# Patient Record
Sex: Female | Born: 1990 | Race: Black or African American | Hispanic: No | Marital: Single | Smoking: Never smoker
Health system: Southern US, Community
[De-identification: ages and names within clinical notes are randomized; demographics above are authoritative.]

## PROBLEM LIST (undated history)

## (undated) ENCOUNTER — Inpatient Hospital Stay (HOSPITAL_COMMUNITY): Payer: Medicaid Other

## (undated) DIAGNOSIS — N83209 Unspecified ovarian cyst, unspecified side: Secondary | ICD-10-CM

## (undated) DIAGNOSIS — K219 Gastro-esophageal reflux disease without esophagitis: Secondary | ICD-10-CM

## (undated) HISTORY — PX: UMBILICAL HERNIA REPAIR: SHX196

## (undated) HISTORY — PX: LIPOSUCTION AUOLOGOUS FAT TRANSFER TO BUTTOCKS: SHX6685

## (undated) HISTORY — PX: LEG SURGERY: SHX1003

## (undated) HISTORY — PX: ECTOPIC PREGNANCY SURGERY: SHX613

---

## 2010-03-02 ENCOUNTER — Emergency Department (HOSPITAL_BASED_OUTPATIENT_CLINIC_OR_DEPARTMENT_OTHER): Admission: EM | Admit: 2010-03-02 | Discharge: 2010-03-02 | Payer: Self-pay | Admitting: Emergency Medicine

## 2010-07-21 ENCOUNTER — Emergency Department (HOSPITAL_BASED_OUTPATIENT_CLINIC_OR_DEPARTMENT_OTHER)
Admission: EM | Admit: 2010-07-21 | Discharge: 2010-07-21 | Payer: Self-pay | Source: Home / Self Care | Admitting: Emergency Medicine

## 2011-03-11 LAB — URINALYSIS, ROUTINE W REFLEX MICROSCOPIC
Glucose, UA: NEGATIVE mg/dL
Ketones, ur: NEGATIVE mg/dL
Protein, ur: NEGATIVE mg/dL

## 2011-03-11 LAB — WET PREP, GENITAL

## 2014-04-20 ENCOUNTER — Emergency Department (HOSPITAL_BASED_OUTPATIENT_CLINIC_OR_DEPARTMENT_OTHER)
Admission: EM | Admit: 2014-04-20 | Discharge: 2014-04-20 | Disposition: A | Discharge status: Home / Self Care | Payer: Self-pay | Attending: Emergency Medicine | Admitting: Emergency Medicine

## 2014-04-20 ENCOUNTER — Encounter (HOSPITAL_BASED_OUTPATIENT_CLINIC_OR_DEPARTMENT_OTHER): Payer: Self-pay | Admitting: Emergency Medicine

## 2014-04-20 DIAGNOSIS — B001 Herpesviral vesicular dermatitis: Secondary | ICD-10-CM

## 2014-04-20 DIAGNOSIS — B009 Herpesviral infection, unspecified: Secondary | ICD-10-CM | POA: Insufficient documentation

## 2014-04-20 MED ORDER — ACYCLOVIR 400 MG PO TABS: 400.0000 mg | ORAL_TABLET | Freq: Four times a day (QID) | ORAL | Status: DC

## 2014-04-20 NOTE — Discharge Instructions (Signed)
Herpes Labialis  You have a fever blister or cold sore (herpes labialis). These painful, grouped sores are caused by one of the herpes viruses (HSV1 most commonly). They are usually found around the lips and mouth, but the same infection can also affect other areas on the face such as the nose and eyes. Herpes infections take about 10 days to heal. They often occur again and again in the same spot. Other symptoms may include numbness and tingling in the involved skin, achiness, fever, and swollen glands in the neck. Colds, emotional stress, injuries, or excess sunlight exposure all seem to make herpes reappear. Herpes lip infections are contagious. Direct contact with these sores can spread the infection. It can also be spread to other parts of your own body.  TREATMENT   Herpes labialis is usually self-limited and resolves within 1 week. To reduce pain and swelling, apply ice packs frequently to the sores or suck on popsicles or frozen juice bars. Antiviral medicine may be used by mouth to shorten the duration of the breakout. Avoid spreading the infection by washing your hands often. Be careful not to touch your eyes or genital areas after handling the infected blisters. Do not kiss or have other intimate contact with others. After the blisters are completely healed you may resume contact. Use sunscreen to lessen recurrences.   If this is your first infection with herpes, or if you have a severe or repeated infections, your caregiver may prescribe one of the anti-viral drugs to speed up the healing. If you have sun-related flare-ups despite the use of sunscreen, starting oral anti-viral medicine before a prolonged exposure (going skiing or to the beach) can prevent most episodes.   SEEK IMMEDIATE MEDICAL CARE IF:  · You develop a headache, sleepiness, high fever, vomiting, or severe weakness.  · You have eye irritation, pain, blurred vision or redness.  · You develop a prolonged infection not getting better in 10  days.  Document Released: 12/11/2005 Document Revised: 03/04/2012 Document Reviewed: 10/15/2009  ExitCare® Patient Information ©2014 [employer].

## 2014-04-20 NOTE — ED Notes (Signed)
Upper lip is swollen. She bit her lip last week and now it is swollen and painful with pus coming from it at times.

## 2014-04-20 NOTE — ED Provider Notes (Signed)
CSN: [id number]     Arrival date & time 04/20/14  1452 History   First MD Initiated Contact with Patient 04/20/14 1529     Chief Complaint  Patient presents with  . Oral Swelling     (Consider location/radiation/quality/duration/timing/severity/associated sxs/prior Treatment) Patient is a 23 y.o. female presenting with mouth sores. The history is provided by the patient. No language interpreter was used.  Mouth Lesions Location:  Upper lip Upper lip location:  L outer Quality:  Weeping, ulcerous and red Onset quality:  Gradual Severity:  Moderate Duration:  1 week Progression:  Worsening Chronicity:  New Relieved by:  Nothing Worsened by:  Nothing tried Ineffective treatments:  None tried Associated symptoms: no fever     History reviewed. No pertinent past medical history. History reviewed. No pertinent past surgical history. No family history on file. History  Substance Use Topics  . Smoking status: Never Smoker   . Smokeless tobacco: Not on file  . Alcohol Use: Yes   OB History   Grav Para Term Preterm Abortions TAB SAB Ect Mult Living                 Review of Systems  Constitutional: Negative for fever.  HENT: Positive for mouth sores.   All other systems reviewed and are negative.     Allergies  Review of patient's allergies indicates no known allergies.  Home Medications   Prior to Admission medications   Not on File   BP 141/85  Pulse 72  Temp(Src) 99.6 F (37.6 C) (Oral)  Resp 18  Ht 5\' 4"  (1.626 m)  Wt 180 lb (81.647 kg)  BMI 30.88 kg/m2  SpO2 100%  LMP 04/06/2014 Physical Exam  Nursing note and vitals reviewed. Constitutional: She is oriented to person, place, and time. She appears well-developed and well-nourished.  HENT:  Head: Normocephalic.  Mouth/Throat: Oropharynx is clear and moist.  Cold sore/herpetic appearing lesion left lip  Eyes: EOM are normal.  Neck: Normal range of motion.  Pulmonary/Chest: Effort normal.   Abdominal: She exhibits no distension.  Musculoskeletal: Normal range of motion.  Neurological: She is alert and oriented to person, place, and time.  Psychiatric: She has a normal mood and affect.    ED Course  Procedures (including critical care time) Labs Review Labs Reviewed - No data to display  Imaging Review No results found.   EKG Interpretation None      MDM   Final diagnoses:  Herpes simplex labialis    acyclovir    Lisa AreasLeslie K Ethlyn Alto, PA-C 04/20/14 1552

## 2014-04-21 NOTE — ED Provider Notes (Signed)
Medical screening examination/treatment/procedure(s) were performed by non-physician practitioner and as supervising physician I was immediately available for consultation/collaboration.   EKG Interpretation None        Lisa B. Bernette MayersSheldon, MD 04/21/14 1339

## 2014-06-04 ENCOUNTER — Encounter (HOSPITAL_BASED_OUTPATIENT_CLINIC_OR_DEPARTMENT_OTHER): Payer: Self-pay | Admitting: Emergency Medicine

## 2014-06-04 ENCOUNTER — Emergency Department (HOSPITAL_BASED_OUTPATIENT_CLINIC_OR_DEPARTMENT_OTHER)
Admission: EM | Admit: 2014-06-04 | Discharge: 2014-06-04 | Disposition: A | Discharge status: Home / Self Care | Payer: Self-pay | Attending: Emergency Medicine | Admitting: Emergency Medicine

## 2014-06-04 DIAGNOSIS — S61451A Open bite of right hand, initial encounter: Secondary | ICD-10-CM

## 2014-06-04 DIAGNOSIS — Z79899 Other long term (current) drug therapy: Secondary | ICD-10-CM | POA: Insufficient documentation

## 2014-06-04 DIAGNOSIS — Y929 Unspecified place or not applicable: Secondary | ICD-10-CM | POA: Insufficient documentation

## 2014-06-04 DIAGNOSIS — Y939 Activity, unspecified: Secondary | ICD-10-CM | POA: Insufficient documentation

## 2014-06-04 DIAGNOSIS — S61409A Unspecified open wound of unspecified hand, initial encounter: Secondary | ICD-10-CM | POA: Insufficient documentation

## 2014-06-04 DIAGNOSIS — W540XXA Bitten by dog, initial encounter: Secondary | ICD-10-CM | POA: Insufficient documentation

## 2014-06-04 DIAGNOSIS — Z792 Long term (current) use of antibiotics: Secondary | ICD-10-CM | POA: Insufficient documentation

## 2014-06-04 MED ORDER — AMOXICILLIN-POT CLAVULANATE 875-125 MG PO TABS: 1.0000 | ORAL_TABLET | Freq: Two times a day (BID) | ORAL | Status: DC

## 2014-06-04 NOTE — ED Provider Notes (Signed)
CSN: [id number]     Arrival date & time 06/04/14  1809 History   First MD Initiated Contact with Patient 06/04/14 1921     Chief Complaint  Patient presents with  . Animal Bite     (Consider location/radiation/quality/duration/timing/severity/associated sxs/prior Treatment) Patient is a 23 y.o. female presenting with animal bite. The history is provided by the patient. No language interpreter was used.  Animal Bite Contact animal:  Dog Location:  Hand Hand injury location:  R hand Time since incident:  1 day Pain details:    Quality:  Aching   Severity:  Moderate   Timing:  Constant   Progression:  Worsening Incident location:  Home Notifications:  None Animal's rabies vaccination status:  Up to date Animal in possession: yes   Tetanus status:  Up to date Worsened by:  Nothing tried Ineffective treatments:  None tried   History reviewed. No pertinent past medical history. History reviewed. No pertinent past surgical history. No family history on file. History  Substance Use Topics  . Smoking status: Never Smoker   . Smokeless tobacco: Not on file  . Alcohol Use: Yes   OB History   Grav Para Term Preterm Abortions TAB SAB Ect Mult Living                 Review of Systems  Skin: Positive for wound.  All other systems reviewed and are negative.     Allergies  Review of patient's allergies indicates no known allergies.  Home Medications   Prior to Admission medications   Medication Sig Start Date End Date Taking? Authorizing Provider  acyclovir (ZOVIRAX) 400 MG tablet Take 1 tablet (400 mg total) by mouth 4 (four) times daily. 04/20/14   Elson Areas, PA-C  amoxicillin-clavulanate (AUGMENTIN) 875-125 MG per tablet Take 1 tablet by mouth 2 (two) times daily. 06/04/14   Elson Areas, PA-C   BP 139/85  Pulse 70  Temp(Src) 99.4 F (37.4 C) (Oral)  Resp 18  Ht 5\' 4"  (1.626 m)  Wt 180 lb (81.647 kg)  BMI 30.88 kg/m2  SpO2 99%  LMP 05/21/2014 Physical  Exam  Nursing note and vitals reviewed. Constitutional: She appears well-developed and well-nourished.  HENT:  Head: Normocephalic and atraumatic.  Musculoskeletal: She exhibits tenderness.  Neurological: She is alert.  Skin: There is erythema.  Puncture wound thenar area right hand,      ED Course  Procedures (including critical care time) Labs Review Labs Reviewed - No data to display  Imaging Review No results found.   EKG Interpretation None      MDM   Final diagnoses:  Dog bite of right hand    augmentin    Elson Areas, PA-C 06/04/14 1947

## 2014-06-04 NOTE — Discharge Instructions (Signed)

## 2014-06-04 NOTE — ED Notes (Signed)
Animal bite form faxed to Animal control, per registration

## 2014-06-04 NOTE — ED Notes (Signed)
[facility] notified of animal bite and will send an Technical sales engineer.

## 2014-06-04 NOTE — ED Notes (Addendum)
Puncture wound from dog bite to to base of  Rt thumb and joint of thumb  slight swelling

## 2014-06-04 NOTE — ED Notes (Signed)
Pt was bitten on her right hand by a dog that lives in her apartment complex. She did not report it to animal control.

## 2014-06-06 NOTE — ED Provider Notes (Signed)
History/physical exam/procedure(s) were performed by non-physician practitioner and as supervising physician I was immediately available for consultation/collaboration. I have reviewed all notes and am in agreement with care and plan.   Hilario Quarryanielle S Anniemae Haberkorn, MD 06/06/14 [phone]

## 2017-01-15 ENCOUNTER — Encounter (HOSPITAL_BASED_OUTPATIENT_CLINIC_OR_DEPARTMENT_OTHER): Payer: Self-pay | Admitting: *Deleted

## 2017-01-15 DIAGNOSIS — N760 Acute vaginitis: Secondary | ICD-10-CM | POA: Insufficient documentation

## 2017-01-15 DIAGNOSIS — N730 Acute parametritis and pelvic cellulitis: Secondary | ICD-10-CM | POA: Insufficient documentation

## 2017-01-15 LAB — URINALYSIS, ROUTINE W REFLEX MICROSCOPIC
Bilirubin Urine: NEGATIVE
Glucose, UA: NEGATIVE mg/dL
Hgb urine dipstick: NEGATIVE
Ketones, ur: NEGATIVE mg/dL
LEUKOCYTES UA: NEGATIVE
NITRITE: NEGATIVE
PROTEIN: NEGATIVE mg/dL
SPECIFIC GRAVITY, URINE: 1.04 — AB (ref 1.005–1.030)
pH: 6 (ref 5.0–8.0)

## 2017-01-15 LAB — PREGNANCY, URINE: PREG TEST UR: NEGATIVE

## 2017-01-15 NOTE — ED Triage Notes (Signed)
Pt c/o lower abd/pelvic pain x 2 days, denies discharge

## 2017-01-16 ENCOUNTER — Emergency Department (HOSPITAL_BASED_OUTPATIENT_CLINIC_OR_DEPARTMENT_OTHER)
Admission: EM | Admit: 2017-01-16 | Discharge: 2017-01-16 | Disposition: A | Discharge status: Home / Self Care | Payer: Self-pay | Attending: Emergency Medicine | Admitting: Emergency Medicine

## 2017-01-16 DIAGNOSIS — N730 Acute parametritis and pelvic cellulitis: Secondary | ICD-10-CM

## 2017-01-16 DIAGNOSIS — B9689 Other specified bacterial agents as the cause of diseases classified elsewhere: Secondary | ICD-10-CM

## 2017-01-16 DIAGNOSIS — N760 Acute vaginitis: Secondary | ICD-10-CM

## 2017-01-16 LAB — WET PREP, GENITAL
Sperm: NONE SEEN
Trich, Wet Prep: NONE SEEN
WBC, Wet Prep HPF POC: NONE SEEN
Yeast Wet Prep HPF POC: NONE SEEN

## 2017-01-16 MED ORDER — METRONIDAZOLE 500 MG PO TABS
500.0000 mg | ORAL_TABLET | Freq: Once | ORAL | Status: AC
  Administered 2017-01-16: 500 mg via ORAL
  Filled 2017-01-16: qty 1

## 2017-01-16 MED ORDER — CEFTRIAXONE SODIUM 250 MG IJ SOLR
250.0000 mg | Freq: Once | INTRAMUSCULAR | Status: AC
  Administered 2017-01-16: 250 mg via INTRAMUSCULAR
  Filled 2017-01-16: qty 250

## 2017-01-16 MED ORDER — IBUPROFEN 800 MG PO TABS: 800.0000 mg | ORAL_TABLET | Freq: Three times a day (TID) | ORAL | 0 refills | Status: DC | PRN

## 2017-01-16 MED ORDER — DOXYCYCLINE HYCLATE 100 MG PO TABS
100.0000 mg | ORAL_TABLET | Freq: Once | ORAL | Status: AC
  Administered 2017-01-16: 100 mg via ORAL
  Filled 2017-01-16: qty 1

## 2017-01-16 MED ORDER — LIDOCAINE HCL (PF) 1 % IJ SOLN
INTRAMUSCULAR | Status: AC
  Administered 2017-01-16: 1.2 mL
  Filled 2017-01-16: qty 5

## 2017-01-16 MED ORDER — METRONIDAZOLE 500 MG PO TABS: 500.0000 mg | ORAL_TABLET | Freq: Two times a day (BID) | ORAL | 0 refills | Status: DC

## 2017-01-16 MED ORDER — DOXYCYCLINE HYCLATE 100 MG PO CAPS: 100.0000 mg | ORAL_CAPSULE | Freq: Two times a day (BID) | ORAL | 0 refills | Status: DC

## 2017-01-16 NOTE — ED Provider Notes (Signed)
By signing my name below, I, Vista Mink, attest that this documentation has been prepared under the direction and in the presence of Ariel Dimitri N Denilson Salminen, DO. Electronically signed, Vista Mink, ED Scribe. 01/16/17. 2:38 AM.  TIME SEEN: 2:36 AM  CHIEF COMPLAINT: Abdominal pain.  HPI:  HPI Comments: Lisa Fry is a 26 y.o. female who presents to the Emergency Department complaining of waxing and waning, sharp lower/suprapubic abdominal pain that started two days ago. When she went to urinate yesterday morning she noted blood on the toilet tissue which worried her. She describes this pain as a cramping, sharp sensation. Her LNMP was 01/03/16 of this month. Pt has had chlamydia in the past. She does note concerns for possible STD's. She is currently sexually active with one partner and does not always use protection. No fever, chills, nausea, vomiting, diarrhea. No abnormal vaginal discharge. She is G3P3A0.  No previous abdominal surgery.  ROS: See HPI Constitutional: no fever  Eyes: no drainage  ENT: no runny nose   Cardiovascular:  no chest pain  Resp: no SOB  GI: no vomiting GU: no dysuria or hematuria Integumentary: no rash  Allergy: no hives  Musculoskeletal: no leg swelling  Neurological: no slurred speech ROS otherwise negative  PAST MEDICAL HISTORY/PAST SURGICAL HISTORY:  History reviewed. No pertinent past medical history.  MEDICATIONS:  Prior to Admission medications   Not on File    ALLERGIES:  No Known Allergies  SOCIAL HISTORY:  Social History  Substance Use Topics  . Smoking status: Never Smoker  . Smokeless tobacco: Not on file  . Alcohol use Yes    FAMILY HISTORY: No family history on file.  EXAM: BP 124/89   Pulse 78   Temp 97.8 F (36.6 C)   Resp 16   Ht 5\' 4"  (1.626 m)   Wt 215 lb (97.5 kg)   LMP 12/31/2016   SpO2 100%   BMI 36.90 kg/m  CONSTITUTIONAL: Alert and oriented and responds appropriately to questions. Well-appearing; well-nourished,  Afebrile, no distress HEAD: Normocephalic EYES: Conjunctivae clear, PERRL, EOMI ENT: normal nose; no rhinorrhea; moist mucous membranes NECK: Supple, no meningismus, no nuchal rigidity, no LAD  CARD: RRR; S1 and S2 appreciated; no murmurs, no clicks, no rubs, no gallops RESP: Normal chest excursion without splinting or tachypnea; breath sounds clear and equal bilaterally; no wheezes, no rhonchi, no rales, no hypoxia or respiratory distress, speaking full sentences ABD/GI: Normal bowel sounds; non-distended; soft, non-tender, no rebound, no guarding, no peritoneal signs, no hepatosplenomegaly GU:  Normal external genitalia. No lesions, rashes noted. Patient has no vaginal bleeding on exam. Thick white foul-smelling vaginal discharge.  No adnexal tenderness, mass or fullness.  Patient does have mild cervical motion tenderness that she reports is increased from baseline pelvic exam. Cervix is not appear friable.  Cervix is closed.  Chaperone present for exam. BACK:  The back appears normal and is non-tender to palpation, there is no CVA tenderness EXT: Normal ROM in all joints; non-tender to palpation; no edema; normal capillary refill; no cyanosis, no calf tenderness or swelling    SKIN: Normal color for age and race; warm; no rash NEURO: Moves all extremities equally, sensation to light touch intact diffusely, cranial nerves II through XII intact, normal speech PSYCH: The patient's mood and manner are appropriate. Grooming and personal hygiene are appropriate.  MEDICAL DECISION MAKING: Patient here with mouth swelling vaginal discharge. Wet prep positive for clue cells. Given symptomatically treat with Flagyl. Also has cervical motion tenderness on  exam that is very mild. Will cover for acute PID. Will give ceftriaxone and discharged on doxycycline for the next 2 weeks. Urine shows no infection and she is not pregnant. No adnexal tenderness or masses to suggest TOA, torsion. Doubt appendicitis.  Recommended alternating Tylenol and Motrin. Given outpatient OB/GYN follow-up. Recommended that patient avoid any sexual intercourse until her antibiotics are complete and recommended that her partner be checked for STDs as well. Recommended follow-up with the health department or a PCP or OB/GYN for HIV, hepatitis, syphilis testing.  At this time, I do not feel there is any life-threatening condition present. I have reviewed and discussed all results (EKG, imaging, lab, urine as appropriate) and exam findings with patient/family. I have reviewed nursing notes and appropriate previous records.  I feel the patient is safe to be discharged home without further emergent workup and can continue workup as an outpatient as needed. Discussed usual and customary return precautions. Patient/family verbalize understanding and are comfortable with this plan.  Outpatient follow-up has been provided. All questions have been answered.   I personally performed the services described in this documentation, which was scribed in my presence. The recorded information has been reviewed and is accurate.     Layla MawKristen N Shana Zavaleta, DO 01/16/17 [phone]

## 2017-01-16 NOTE — Discharge Instructions (Signed)
You may alternate between Tylenol 1000 mg every 6 hours as needed for fever, pain and ibuprofen 800 mg every 8 hours as needed for fever and pain. Please avoid any sexual intercourse for 2 weeks until your doxycycline is complete. Your partner will need to be checked for sexually transmitted diseases. I recommend close follow-up with your OB/GYN. If you do not have an OB/GYN, we have provided with a list below.     [email or web address] [address] [place], [place] [phone]    [facility] [email or web address] [address] [place], [place] [phone]    [facility] [address] [place], [place] [phone]   [facility] [email or web address] [address] [place], [place] [phone]   [facility] [email or web address] [address] [place], [place] [phone]   [email or web address] [address] [place], [place] [phone]

## 2017-01-17 LAB — GC/CHLAMYDIA PROBE AMP (~~LOC~~) NOT AT ARMC
CHLAMYDIA, DNA PROBE: NEGATIVE
NEISSERIA GONORRHEA: NEGATIVE

## 2017-10-05 ENCOUNTER — Emergency Department (HOSPITAL_BASED_OUTPATIENT_CLINIC_OR_DEPARTMENT_OTHER)
Admission: EM | Admit: 2017-10-05 | Discharge: 2017-10-05 | Disposition: A | Discharge status: Home / Self Care | Payer: Self-pay | Attending: Emergency Medicine | Admitting: Emergency Medicine

## 2017-10-05 ENCOUNTER — Emergency Department (HOSPITAL_BASED_OUTPATIENT_CLINIC_OR_DEPARTMENT_OTHER): Payer: Self-pay

## 2017-10-05 ENCOUNTER — Encounter (HOSPITAL_BASED_OUTPATIENT_CLINIC_OR_DEPARTMENT_OTHER): Payer: Self-pay

## 2017-10-05 DIAGNOSIS — Z87891 Personal history of nicotine dependence: Secondary | ICD-10-CM | POA: Insufficient documentation

## 2017-10-05 DIAGNOSIS — R1110 Vomiting, unspecified: Secondary | ICD-10-CM

## 2017-10-05 DIAGNOSIS — R110 Nausea: Secondary | ICD-10-CM | POA: Insufficient documentation

## 2017-10-05 DIAGNOSIS — A599 Trichomoniasis, unspecified: Secondary | ICD-10-CM | POA: Insufficient documentation

## 2017-10-05 DIAGNOSIS — R112 Nausea with vomiting, unspecified: Secondary | ICD-10-CM | POA: Insufficient documentation

## 2017-10-05 HISTORY — DX: Unspecified ovarian cyst, unspecified side: N83.209

## 2017-10-05 LAB — COMPREHENSIVE METABOLIC PANEL
ALT: 18 U/L (ref 14–54)
ANION GAP: 7 (ref 5–15)
AST: 19 U/L (ref 15–41)
Albumin: 3.6 g/dL (ref 3.5–5.0)
Alkaline Phosphatase: 56 U/L (ref 38–126)
BUN: 11 mg/dL (ref 6–20)
CHLORIDE: 103 mmol/L (ref 101–111)
CO2: 27 mmol/L (ref 22–32)
Calcium: 8.5 mg/dL — ABNORMAL LOW (ref 8.9–10.3)
Creatinine, Ser: 0.78 mg/dL (ref 0.44–1.00)
GFR calc non Af Amer: >60 mL/min (ref >60)
Glucose, Bld: 110 mg/dL — ABNORMAL HIGH (ref 65–99)
POTASSIUM: 3.3 mmol/L — AB (ref 3.5–5.1)
SODIUM: 137 mmol/L (ref 135–145)
Total Bilirubin: 0.5 mg/dL (ref 0.3–1.2)
Total Protein: 7.3 g/dL (ref 6.5–8.1)

## 2017-10-05 LAB — CBC WITH DIFFERENTIAL/PLATELET
BASOS ABS: 0 10*3/uL (ref 0.0–0.1)
BASOS PCT: 0 %
EOS ABS: 0 10*3/uL (ref 0.0–0.7)
Eosinophils Relative: 0 %
HCT: 37 % (ref 36.0–46.0)
Hemoglobin: 12.1 g/dL (ref 12.0–15.0)
Lymphocytes Relative: 26 %
Lymphs Abs: 1.2 10*3/uL (ref 0.7–4.0)
MCH: 29.4 pg (ref 26.0–34.0)
MCHC: 32.7 g/dL (ref 30.0–36.0)
MCV: 90 fL (ref 78.0–100.0)
MONO ABS: 0.3 10*3/uL (ref 0.1–1.0)
Monocytes Relative: 7 %
Neutro Abs: 3.1 10*3/uL (ref 1.7–7.7)
Neutrophils Relative %: 67 %
PLATELETS: 194 10*3/uL (ref 150–400)
RBC: 4.11 MIL/uL (ref 3.87–5.11)
RDW: 13.5 % (ref 11.5–15.5)
WBC: 4.6 10*3/uL (ref 4.0–10.5)

## 2017-10-05 LAB — URINALYSIS, MICROSCOPIC (REFLEX)

## 2017-10-05 LAB — URINALYSIS, ROUTINE W REFLEX MICROSCOPIC
GLUCOSE, UA: NEGATIVE mg/dL
Ketones, ur: NEGATIVE mg/dL
Nitrite: NEGATIVE
PH: 6.5 (ref 5.0–8.0)
Protein, ur: NEGATIVE mg/dL
Specific Gravity, Urine: 1.02 (ref 1.005–1.030)

## 2017-10-05 LAB — PREGNANCY, URINE: Preg Test, Ur: NEGATIVE

## 2017-10-05 LAB — LIPASE, BLOOD: Lipase: 19 U/L (ref 11–51)

## 2017-10-05 MED ORDER — METRONIDAZOLE 500 MG PO TABS
2000.0000 mg | ORAL_TABLET | Freq: Once | ORAL | Status: AC
  Administered 2017-10-05: 2000 mg via ORAL
  Filled 2017-10-05: qty 4

## 2017-10-05 MED ORDER — OMEPRAZOLE 20 MG PO CPDR: 20.0000 mg | DELAYED_RELEASE_CAPSULE | Freq: Every day | ORAL | 1 refills | Status: DC

## 2017-10-05 MED ORDER — ONDANSETRON 4 MG PO TBDP
4.0000 mg | ORAL_TABLET | Freq: Once | ORAL | Status: AC
  Administered 2017-10-05: 4 mg via ORAL
  Filled 2017-10-05: qty 1

## 2017-10-05 MED ORDER — MECLIZINE HCL 25 MG PO TABS: 25.0000 mg | ORAL_TABLET | Freq: Three times a day (TID) | ORAL | 0 refills | Status: DC | PRN

## 2017-10-05 MED FILL — OMEPRAZOLE 20 MG CAP: 20 | 30 days supply | Qty: 30 | Fill #0

## 2017-10-05 MED FILL — MECLIZINE 25 MG TABLET: 25 | 10 days supply | Qty: 30 | Fill #0

## 2017-10-05 NOTE — Discharge Instructions (Signed)
You've been treated today for your trichimoniasis infection. Please have any partners to sleep with it and treated as well.. I'm discharging you with medications for vomiting. Please make sure to drink plenty of water and small sips throughout the day. Please make sure to eat several small meals between 6 and 8 small meals a day instead of 3 large meals. This may be very helpful in making sure you do not vomit. I'm also giving you something to reduce the acid levels in your stomach and a nausea medication. He will need to follow-up with a gastroenterologist if he continued to have persistent vomiting. You may also try a liquid diet and give you instructions on this as well.  Get help right away if: You have pain in your chest, neck, arm, or jaw. You feel extremely weak or you faint. You have persistent vomiting. You have vomit that is bright red or looks like black coffee grounds. You have stools that are bloody or black, or stools that look like tar. You have severe pain, cramping, or bloating in your abdomen. You have a severe headache, a stiff neck, or both. You have a rash. You have trouble breathing or you are breathing very quickly. Your heart is beating very quickly. Your skin feels cold and clammy. You feel confused. You have pain while urinating. You have signs of dehydration, such as: Dark urine, or very little or no urine. Cracked lips. Dry mouth. Sunken eyes. Sleepiness. Weakness.

## 2017-10-05 NOTE — ED Notes (Signed)
Patient was given saltines and water. Patient was told to ring call light if vomiting.

## 2017-10-05 NOTE — ED Triage Notes (Signed)
C/o n/v x 2 weeks-NAD-steady gait 

## 2017-10-05 NOTE — ED Notes (Signed)
Given ginger ale and graham crackers. 

## 2017-10-05 NOTE — ED Provider Notes (Signed)
[facility] Note   CSN: [id number] Arrival date & time: 10/05/17  1139     History   Chief Complaint Chief Complaint  Patient presents with  . Emesis    HPI Lisa Fry is a 26 y.o. female who presents emergency Department with chief complaint of post prandial vomiting. Patient states that for the past 2 days every time she eats solid foods about 45 minutes to an hour afterwards she vomits. She denies abdominal pain. She does have a sensation of nausea and fullness in her abdomen. She denies belly satiety. She states that she has tried things like spaghetti, tacos, chicken, rice and none of them will go down without her vomiting afterward. She has not tried any medications for acid reduction. She does have a history of occasional reflux. Fevers, chills, nausea, vomiting, previous abdominal surgeries, constipation. She denies heavy marijuana use, she is able to hold down fluids.  HPI  Past Medical History:  Diagnosis Date  . Cyst of ovary     There are no active problems to display for this patient.   Past Surgical History:  Procedure Laterality Date  . LEG SURGERY Left   . UMBILICAL HERNIA REPAIR      OB History    Gravida Para Term Preterm AB Living   0 1 3   SAB TAB Ectopic Multiple Live Births   0 1 0 0 3       Home Medications    Prior to Admission medications   Not on File    Family History No family history on file.  Social History Social History  Substance Use Topics  . Smoking status: Former Games developer  . Smokeless tobacco: Never Used  . Alcohol use Yes     Comment: occ     Allergies   Patient has no known allergies.   Review of Systems Review of Systems  Ten systems reviewed and are negative for acute change, except as noted in the HPI.   Physical Exam Updated Vital Signs BP (!) 148/93 (BP Location: Left Arm)   Pulse (!) 58   Temp 99.2 F (37.3 C) (Oral)   Resp 16   Ht  (1.626 m)   Wt 88.5 kg  (195 lb)   LMP 09/28/2017   SpO2 100%   BMI 33.47 kg/m   Physical Exam  Constitutional: She is oriented to person, place, and time. She appears well-developed and well-nourished. No distress.  HENT:  Head: Normocephalic and atraumatic.  Eyes: Conjunctivae are normal. No scleral icterus.  Neck: Normal range of motion.  Cardiovascular: Normal rate, regular rhythm and normal heart sounds.  Exam reveals no gallop and no friction rub.   No murmur heard. Pulmonary/Chest: Effort normal and breath sounds normal. No respiratory distress.  Abdominal: Soft. Bowel sounds are normal. She exhibits no distension and no mass. There is no tenderness. There is no guarding.  Neurological: She is alert and oriented to person, place, and time.  Skin: Skin is warm and dry. She is not diaphoretic.  Psychiatric: Her behavior is normal.  Nursing note and vitals reviewed.    ED Treatments / Results  Labs (all labs ordered are listed, but only abnormal results are displayed) Labs Reviewed  URINALYSIS, ROUTINE W REFLEX MICROSCOPIC - Abnormal; Notable for the following:       Result Value   Color, Urine AMBER (*)    APPearance CLOUDY (*)    Hgb urine dipstick MODERATE (*)  Bilirubin Urine SMALL (*)    Leukocytes, UA SMALL (*)    All other components within normal limits  URINALYSIS, MICROSCOPIC (REFLEX) - Abnormal; Notable for the following:    Bacteria, UA MANY (*)    Squamous Epithelial / LPF 0-5 (*)    All other components within normal limits  COMPREHENSIVE METABOLIC PANEL - Abnormal; Notable for the following:    Potassium 3.3 (*)    Glucose, Bld 110 (*)    Calcium 8.5 (*)    All other components within normal limits  PREGNANCY, URINE  CBC WITH DIFFERENTIAL/PLATELET  LIPASE, BLOOD    EKG  EKG Interpretation None       Radiology US Abdomen Complete  Result Date: 10/05/2017 CLINICAL DATA:  Postprandial vomiting x2 weeks EXAM: COMPLETE ABDOMINAL ULTRASOUND FINDINGS: Gallbladder:  Physiologically distended without stones, wall thickening, or pericholecystic fluid. 4 mm gallbladder wall polyp. Sonographer reports no sonographic Murphy's sign. Common bile duct:  Normal in caliber, 1.66mm diameter. Liver: Homogeneous in echotexture without focal lesion or intrahepatic bile duct dilatation. Normal color flow signal in portal vein. IVC:  Negative Pancreas: Visualized segments unremarkable, portions obscured by overlying bowel gas. Spleen:  No focal lesion, craniocaudal  0.2cm in length. Right Kidney:  No mass or hydronephrosis, 10.6cm in length. Left Kidney:  No lesion or hydronephrosis, 11cm in length. Abdominal aorta:  Negative IMPRESSION: 1. No acute findings. Electronically Signed   By: Corlis Leak M.D.   On: 10/05/2017 15:07    Procedures Procedures (including critical care time)  Medications Ordered in ED Medications  ondansetron (ZOFRAN-ODT) disintegrating tablet 4 mg (4 mg Oral Given 10/05/17 1502)     Initial Impression / Assessment and Plan / ED Course  I have reviewed the triage vital signs and the nursing notes.  Pertinent labs & imaging results that were available during my care of the patient were reviewed by me and considered in my medical decision making (see chart for details).     Patient without active vomiting here. Might mild hypokalemia. Her ultrasound is negative for any acute abnormalities. Patient is found to be positive for trichomoniasis and treated with 2 g of oral Flagyl. She did not vomit after treatment. Patient will be discharged with acid reducer and antinausea medications. Have advised the patient to eat several small meals a day. She may also try a liquid diet. She appears safe for discharge at this time he follow up with gastroenterology. I discussed return precautions.  Final Clinical Impressions(s) / ED Diagnoses   Final diagnoses:  Postprandial vomiting  Trichimoniasis    New Prescriptions New Prescriptions   No medications on file      Arthor Captain, PA-C 10/05/17 1620    Rolan Bucco, MD 10/05/17 1920

## 2017-10-05 NOTE — ED Notes (Signed)
Patient transported to Ultrasound 

## 2018-01-17 ENCOUNTER — Emergency Department (HOSPITAL_BASED_OUTPATIENT_CLINIC_OR_DEPARTMENT_OTHER)
Admission: EM | Admit: 2018-01-17 | Discharge: 2018-01-17 | Disposition: A | Discharge status: Home / Self Care | Payer: Self-pay | Attending: Emergency Medicine | Admitting: Emergency Medicine

## 2018-01-17 ENCOUNTER — Other Ambulatory Visit: Payer: Self-pay

## 2018-01-17 ENCOUNTER — Encounter (HOSPITAL_BASED_OUTPATIENT_CLINIC_OR_DEPARTMENT_OTHER): Payer: Self-pay | Admitting: Emergency Medicine

## 2018-01-17 DIAGNOSIS — Z87891 Personal history of nicotine dependence: Secondary | ICD-10-CM | POA: Insufficient documentation

## 2018-01-17 DIAGNOSIS — R3915 Urgency of urination: Secondary | ICD-10-CM | POA: Insufficient documentation

## 2018-01-17 DIAGNOSIS — Z79899 Other long term (current) drug therapy: Secondary | ICD-10-CM | POA: Insufficient documentation

## 2018-01-17 DIAGNOSIS — J069 Acute upper respiratory infection, unspecified: Secondary | ICD-10-CM | POA: Insufficient documentation

## 2018-01-17 LAB — URINALYSIS, MICROSCOPIC (REFLEX)

## 2018-01-17 LAB — URINALYSIS, ROUTINE W REFLEX MICROSCOPIC
BILIRUBIN URINE: NEGATIVE
GLUCOSE, UA: NEGATIVE mg/dL
Ketones, ur: NEGATIVE mg/dL
Leukocytes, UA: NEGATIVE
Nitrite: NEGATIVE
Protein, ur: NEGATIVE mg/dL
SPECIFIC GRAVITY, URINE: 1.02 (ref 1.005–1.030)
pH: 6.5 (ref 5.0–8.0)

## 2018-01-17 LAB — PREGNANCY, URINE: PREG TEST UR: NEGATIVE

## 2018-01-17 MED ORDER — FLUTICASONE PROPIONATE 50 MCG/ACT NA SUSP: 1.0000 | Freq: Every day | NASAL | 2 refills | Status: DC

## 2018-01-17 NOTE — ED Triage Notes (Signed)
Patient states that she is having a "headahce" from her nose with pressure to her forehead. The patient describes it as a "siunus pressure" with the headache  - She also reports urinary frequency "like UTI symptoms"

## 2018-01-17 NOTE — Discharge Instructions (Signed)
Please read attached information. If you experience any new or worsening signs or symptoms please return to the emergency room for evaluation. Please follow-up with your primary care provider or specialist as discussed. Please use medication prescribed only as directed and discontinue taking if you have any concerning signs or symptoms.   °

## 2018-01-17 NOTE — ED Provider Notes (Signed)
` [facility] EMERGENCY DEPARTMENT Provider Note   CSN: [id number] Arrival date & time: 01/17/18  1007     History   Chief Complaint Chief Complaint  Patient presents with  . Nasal Congestion  . Dysuria    HPI Lisa Fry is a 27 y.o. female.  HPI   27 year old female presents today with complaints of nasal congestion.  Patient notes symptoms started approximately 4 days ago with nausea and vomiting no diarrhea.  She notes the symptoms completely resolved, but has developed rhinorrhea, nasal congestion.  She notes that she has no purulent discharge from her nose, reports using Mucinex sinus without symptomatic improvement.  She notes she is having pressure in her sinuses denies any specific pain.  She denies any fever, denies any sore throat chest pain shortness of breath, or abdominal pain.  Patient additionally notes that she is having urinary symptoms, with urinary urgency over the last week, noting this feels somewhat [place] to previous UTI.    Past Medical History:  Diagnosis Date  . Cyst of ovary     There are no active problems to display for this patient.   Past Surgical History:  Procedure Laterality Date  . LEG SURGERY Left   . UMBILICAL HERNIA REPAIR      OB History    Gravida Para Term Preterm AB Living   4 3 3  0 1 3   SAB TAB Ectopic Multiple Live Births   0 1 0 0 3       Home Medications    Prior to Admission medications   Medication Sig Start Date End Date Taking? Authorizing Provider  fluticasone (FLONASE) 50 MCG/ACT nasal spray Place 1 spray into both nostrils daily. 01/17/18   Antoinett Dorman, Tinnie Gens, PA-C  meclizine (ANTIVERT) 25 MG tablet Take 1 tablet (25 mg total) by mouth 3 (three) times daily as needed for dizziness. 10/05/17   Arthor Captain, PA-C  omeprazole (PRILOSEC) 20 MG capsule Take 1 capsule (20 mg total) by mouth daily. 10/05/17   Arthor Captain, PA-C    Family History History reviewed. No pertinent family  history.  Social History Social History   Tobacco Use  . Smoking status: Former Games developer  . Smokeless tobacco: Never Used  Substance Use Topics  . Alcohol use: Yes    Comment: occ  . Drug use: No    Comment: former      Allergies   Patient has no known allergies.   Review of Systems Review of Systems  All other systems reviewed and are negative.    Physical Exam Updated Vital Signs BP 132/82   Pulse 64   Temp 98.3 F (36.8 C) (Oral)   Resp 18   Ht 5\' 4"  (1.626 m)   Wt 86.2 kg (190 lb)   LMP 12/27/2017   SpO2 100%   BMI 32.61 kg/m   Physical Exam  Constitutional: She is oriented to person, place, and time. She appears well-developed and well-nourished.  HENT:  Head: Normocephalic and atraumatic.  Right Ear: Hearing and tympanic membrane normal.  Left Ear: Hearing and tympanic membrane normal.  Mouth/Throat: Uvula is midline and mucous membranes are normal. No oropharyngeal exudate, posterior oropharyngeal edema, posterior oropharyngeal erythema or tonsillar abscesses. Tonsils are 0 on the right. Tonsils are 0 on the left. No tonsillar exudate.  Eyes: Conjunctivae are normal. Pupils are equal, round, and reactive to light. Right eye exhibits no discharge. Left eye exhibits no discharge. No scleral icterus.  Neck: Normal range of motion.  No JVD present. No tracheal deviation present.  Pulmonary/Chest: Effort normal. No stridor.  Abdominal: She exhibits no distension and no mass. There is no tenderness. There is no rebound and no guarding. No hernia.  Neurological: She is alert and oriented to person, place, and time. Coordination normal.  Psychiatric: She has a normal mood and affect. Her behavior is normal. Judgment and thought content normal.  Nursing note and vitals reviewed.    ED Treatments / Results  Labs (all labs ordered are listed, but only abnormal results are displayed) Labs Reviewed  URINALYSIS, ROUTINE W REFLEX MICROSCOPIC - Abnormal; Notable for  the following components:      Result Value   Hgb urine dipstick SMALL (*)    All other components within normal limits  URINALYSIS, MICROSCOPIC (REFLEX) - Abnormal; Notable for the following components:   Bacteria, UA RARE (*)    Squamous Epithelial / LPF 0-5 (*)    All other components within normal limits  URINE CULTURE  PREGNANCY, URINE    EKG  EKG Interpretation None       Radiology No results found.  Procedures Procedures (including critical care time)  Medications Ordered in ED Medications - No data to display   Initial Impression / Assessment and Plan / ED Course  I have reviewed the triage vital signs and the nursing notes.  Pertinent labs & imaging results that were available during my care of the patient were reviewed by me and considered in my medical decision making (see chart for details).     Final Clinical Impressions(s) / ED Diagnoses   Final diagnoses:  Viral upper respiratory tract infection  Urinary urgency    Labs: Urinalysis, urine pregnancy  Imaging:   Consults:  Therapeutics:  Discharge Meds: Flonase  Assessment/Plan:   27 year old female presents today with several complaints.  Patient with upper respiratory congestion.  This appears to be sinus related, no signs of acute bacterial sinusitis.  She will be treated symptomatically and given strict return precautions.  Patient also having urinary urgency, her urine is very reassuring with no signs of urinary tract infection.  Patient is instructed to drink water, follow-up with primary care.  Strict return precautions given.  She verbalized understanding and agreement to today's plan had no further questions or concerns at the time of discharge.    ED Discharge Orders        Ordered    fluticasone (FLONASE) 50 MCG/ACT nasal spray  Daily     01/17/18 1318          Eyvonne MechanicHedges, Lorann Tani, PA-C 01/17/18 1337    Shaune PollackIsaacs, Cameron, MD 01/18/18 [phone]

## 2018-01-18 LAB — URINE CULTURE: Culture: NO GROWTH

## 2018-03-17 ENCOUNTER — Emergency Department (HOSPITAL_COMMUNITY): Payer: No Typology Code available for payment source

## 2018-03-17 ENCOUNTER — Encounter (HOSPITAL_COMMUNITY): Payer: Self-pay | Admitting: Emergency Medicine

## 2018-03-17 ENCOUNTER — Emergency Department (HOSPITAL_COMMUNITY)
Admission: EM | Admit: 2018-03-17 | Discharge: 2018-03-17 | Disposition: A | Discharge status: Home / Self Care | Payer: No Typology Code available for payment source | Attending: Emergency Medicine | Admitting: Emergency Medicine

## 2018-03-17 DIAGNOSIS — Y998 Other external cause status: Secondary | ICD-10-CM | POA: Insufficient documentation

## 2018-03-17 DIAGNOSIS — Y92410 Unspecified street and highway as the place of occurrence of the external cause: Secondary | ICD-10-CM | POA: Diagnosis not present

## 2018-03-17 DIAGNOSIS — R52 Pain, unspecified: Secondary | ICD-10-CM

## 2018-03-17 DIAGNOSIS — M25552 Pain in left hip: Secondary | ICD-10-CM | POA: Insufficient documentation

## 2018-03-17 DIAGNOSIS — Y939 Activity, unspecified: Secondary | ICD-10-CM | POA: Insufficient documentation

## 2018-03-17 DIAGNOSIS — Z87891 Personal history of nicotine dependence: Secondary | ICD-10-CM | POA: Insufficient documentation

## 2018-03-17 LAB — POC URINE PREG, ED: Preg Test, Ur: NEGATIVE

## 2018-03-17 MED ORDER — OXYCODONE-ACETAMINOPHEN 5-325 MG PO TABS
2.0000 | ORAL_TABLET | Freq: Once | ORAL | Status: AC
  Administered 2018-03-17: 2 via ORAL
  Filled 2018-03-17: qty 2

## 2018-03-17 MED ORDER — KETOROLAC TROMETHAMINE 60 MG/2ML IM SOLN
60.0000 mg | Freq: Once | INTRAMUSCULAR | Status: AC
  Administered 2018-03-17: 60 mg via INTRAMUSCULAR
  Filled 2018-03-17: qty 2

## 2018-03-17 MED ORDER — IBUPROFEN 800 MG PO TABS: 800.0000 mg | ORAL_TABLET | Freq: Three times a day (TID) | ORAL | 0 refills | Status: DC
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
## 2018-03-17 NOTE — ED Notes (Signed)
Patient transported to X-ray 
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
## 2018-10-01 ENCOUNTER — Other Ambulatory Visit: Payer: Self-pay

## 2018-10-01 ENCOUNTER — Emergency Department (HOSPITAL_BASED_OUTPATIENT_CLINIC_OR_DEPARTMENT_OTHER)
Admission: EM | Admit: 2018-10-01 | Discharge: 2018-10-01 | Disposition: A | Discharge status: Home / Self Care | Payer: Self-pay | Attending: Emergency Medicine | Admitting: Emergency Medicine

## 2018-10-01 ENCOUNTER — Encounter (HOSPITAL_BASED_OUTPATIENT_CLINIC_OR_DEPARTMENT_OTHER): Payer: Self-pay | Admitting: *Deleted

## 2018-10-01 DIAGNOSIS — J020 Streptococcal pharyngitis: Secondary | ICD-10-CM | POA: Insufficient documentation

## 2018-10-01 DIAGNOSIS — Z87891 Personal history of nicotine dependence: Secondary | ICD-10-CM | POA: Insufficient documentation

## 2018-10-01 LAB — GROUP A STREP BY PCR: Group A Strep by PCR: DETECTED — AB

## 2018-10-01 MED ORDER — DEXAMETHASONE SODIUM PHOSPHATE 10 MG/ML IJ SOLN
10.0000 mg | Freq: Once | INTRAMUSCULAR | Status: AC
  Administered 2018-10-01: 10 mg via INTRAVENOUS
  Filled 2018-10-01: qty 1

## 2018-10-01 MED ORDER — SODIUM CHLORIDE 0.9 % IV BOLUS
1000.0000 mL | Freq: Once | INTRAVENOUS | Status: AC
  Administered 2018-10-01: 1000 mL via INTRAVENOUS

## 2018-10-01 MED ORDER — ACETAMINOPHEN 160 MG/5ML PO SOLN
650.0000 mg | Freq: Once | ORAL | Status: AC
  Administered 2018-10-01: 650 mg via ORAL
  Filled 2018-10-01: qty 20.3

## 2018-10-01 MED ORDER — PENICILLIN G BENZATHINE 1200000 UNIT/2ML IM SUSP
1.2000 10*6.[IU] | Freq: Once | INTRAMUSCULAR | Status: AC
  Administered 2018-10-01: 1.2 10*6.[IU] via INTRAMUSCULAR
  Filled 2018-10-01: qty 2

## 2018-10-01 NOTE — ED Triage Notes (Signed)
Sore throat x 2 days. Chills.

## 2018-10-01 NOTE — Discharge Instructions (Addendum)
Home to rest. Push fluids. Motrin and tylenol as needed as directed for pain and fevers. Be sure to replace your toothbrush Thursday morning. Return to ER for any worsening or concerning symptoms.

## 2018-10-01 NOTE — ED Provider Notes (Signed)
[facility] EMERGENCY DEPARTMENT Provider Note   CSN: [id number] Arrival date & time: 10/01/18  2211     History   Chief Complaint Chief Complaint  Patient presents with  . Sore Throat    HPI Lisa Fry is a 27 y.o. female.  27 year old female presents with complaint of sore throat x2 days.  Reports fevers and chills, states she is been wearing her winter coat in her house for the last 2 days.  Patient reports pain with swallowing, is currently spitting her secretions into an emesis bag.  Is exposed to her children who have had cough and cold symptoms, no known contact with strep.  She denies cough, congestion, difficulty breathing.  No other complaints or concerns.     Past Medical History:  Diagnosis Date  . Cyst of ovary     There are no active problems to display for this patient.   Past Surgical History:  Procedure Laterality Date  . LEG SURGERY Left   . UMBILICAL HERNIA REPAIR       OB History    Gravida  4   Para  3   Term  3   Preterm  0   AB  1   Living  3     SAB  0   TAB  1   Ectopic  0   Multiple  0   Live Births  3            Home Medications    Prior to Admission medications   Medication Sig Start Date End Date Taking? Authorizing Provider  fluticasone (FLONASE) 50 MCG/ACT nasal spray Place 1 spray into both nostrils daily. Patient not taking: Reported on 03/17/2018 01/17/18   Hedges, Tinnie Gens, PA-C  ibuprofen (ADVIL,MOTRIN) 800 MG tablet Take 1 tablet (800 mg total) by mouth 3 (three) times daily. 03/17/18   Tilden Fossa, MD  meclizine (ANTIVERT) 25 MG tablet Take 1 tablet (25 mg total) by mouth 3 (three) times daily as needed for dizziness. Patient not taking: Reported on 03/17/2018 10/05/17   Arthor Captain, PA-C  omeprazole (PRILOSEC) 20 MG capsule Take 1 capsule (20 mg total) by mouth daily. Patient not taking: Reported on 03/17/2018 10/05/17   Arthor Captain, PA-C    Family History No family history  on file.  Social History Social History   Tobacco Use  . Smoking status: Former Games developer  . Smokeless tobacco: Never Used  Substance Use Topics  . Alcohol use: Yes    Comment: occ  . Drug use: No    Comment: former      Allergies   Patient has no known allergies.   Review of Systems Review of Systems  Constitutional: Positive for chills and fever.  HENT: Positive for sore throat, trouble swallowing and voice change. Negative for congestion, ear pain, facial swelling, rhinorrhea, sinus pressure and sinus pain.   Respiratory: Negative for cough, shortness of breath and wheezing.   Gastrointestinal: Negative for nausea and vomiting.  Musculoskeletal: Negative for arthralgias and myalgias.  Skin: Negative for rash and wound.  Allergic/Immunologic: Negative for immunocompromised state.  Neurological: Negative for headaches.  Hematological: Positive for adenopathy. Does not bruise/bleed easily.  Psychiatric/Behavioral: Negative for confusion.  All other systems reviewed and are negative.    Physical Exam Updated Vital Signs BP 125/83 (BP Location: Left Arm)   Pulse 86   Temp (!) 100.4 F (38 C) (Oral)   Resp 18   Ht 5\' 4"  (1.626 m)   Wt 90.7  kg   LMP 09/24/2018   SpO2 100%   BMI 34.33 kg/m   Physical Exam  Constitutional: She is oriented to person, place, and time. She appears well-developed and well-nourished. No distress.  HENT:  Head: Normocephalic and atraumatic.  Right Ear: Hearing, tympanic membrane and ear canal normal.  Left Ear: Hearing, tympanic membrane and ear canal normal.  Mouth/Throat: Uvula is midline and mucous membranes are normal. No uvula swelling. No oropharyngeal exudate, posterior oropharyngeal edema, posterior oropharyngeal erythema or tonsillar abscesses. Tonsils are 2+ on the right. Tonsils are 2+ on the left. Tonsillar exudate.  Cardiovascular: Normal rate, regular rhythm, normal heart sounds and intact distal pulses.  Pulmonary/Chest:  Effort normal.  Lymphadenopathy:       Head (right side): Tonsillar adenopathy present.       Head (left side): Tonsillar adenopathy present.    She has cervical adenopathy.  Neurological: She is alert and oriented to person, place, and time.  Skin: Skin is warm and dry. She is not diaphoretic.  Psychiatric: She has a normal mood and affect. Her behavior is normal.  Nursing note and vitals reviewed.    ED Treatments / Results  Labs (all labs ordered are listed, but only abnormal results are displayed) Labs Reviewed  GROUP A STREP BY PCR - Abnormal; Notable for the following components:      Result Value   Group A Strep by PCR DETECTED (*)    All other components within normal limits    EKG None  Radiology No results found.  Procedures Procedures (including critical care time)  Medications Ordered in ED Medications  sodium chloride 0.9 % bolus 1,000 mL (1,000 mLs Intravenous New Bag/Given 10/01/18 2249)  penicillin g benzathine (BICILLIN LA) 1200000 UNIT/2ML injection 1.2 Million Units (has no administration in time range)  dexamethasone (DECADRON) injection 10 mg (10 mg Intravenous Given 10/01/18 2252)  acetaminophen (TYLENOL) solution 650 mg (650 mg Oral Given 10/01/18 2252)     Initial Impression / Assessment and Plan / ED Course  I have reviewed the triage vital signs and the nursing notes.  Pertinent labs & imaging results that were available during my care of the patient were reviewed by me and considered in my medical decision making (see chart for details).  Clinical Course as of Oct 01 2321  Tue Oct 01, 2018  2319 27yo female with sore throat and fever x 2 days. On exam, enlarged tonsils with heavy exudate, +anterior cervical lymph nodes. Rapid strep +, given decadron, IV fluids, liquid tylenol and IM bicillin. Patient is tolerating small sips of fluid. Recommend return to ER for worsening or concerning symptoms, otherwise recheck with PCP.   [LM]    Clinical  Course User Index [LM] Jeannie Fend, PA-C   Final Clinical Impressions(s) / ED Diagnoses   Final diagnoses:  Strep pharyngitis    ED Discharge Orders    None       Jeannie Fend, PA-C 10/01/18 2322    Melene Plan, DO 10/01/18 2333

## 2018-11-05 ENCOUNTER — Emergency Department (HOSPITAL_BASED_OUTPATIENT_CLINIC_OR_DEPARTMENT_OTHER)
Admission: EM | Admit: 2018-11-05 | Discharge: 2018-11-05 | Disposition: A | Discharge status: Home / Self Care | Payer: Self-pay | Attending: Emergency Medicine | Admitting: Emergency Medicine

## 2018-11-05 ENCOUNTER — Other Ambulatory Visit: Payer: Self-pay

## 2018-11-05 ENCOUNTER — Encounter (HOSPITAL_BASED_OUTPATIENT_CLINIC_OR_DEPARTMENT_OTHER): Payer: Self-pay | Admitting: *Deleted

## 2018-11-05 DIAGNOSIS — Y9389 Activity, other specified: Secondary | ICD-10-CM | POA: Insufficient documentation

## 2018-11-05 DIAGNOSIS — W01198A Fall on same level from slipping, tripping and stumbling with subsequent striking against other object, initial encounter: Secondary | ICD-10-CM | POA: Insufficient documentation

## 2018-11-05 DIAGNOSIS — S39012A Strain of muscle, fascia and tendon of lower back, initial encounter: Secondary | ICD-10-CM | POA: Insufficient documentation

## 2018-11-05 DIAGNOSIS — Y999 Unspecified external cause status: Secondary | ICD-10-CM | POA: Insufficient documentation

## 2018-11-05 DIAGNOSIS — T148XXA Other injury of unspecified body region, initial encounter: Secondary | ICD-10-CM

## 2018-11-05 DIAGNOSIS — Y92511 Restaurant or cafe as the place of occurrence of the external cause: Secondary | ICD-10-CM | POA: Insufficient documentation

## 2018-11-05 DIAGNOSIS — Z87891 Personal history of nicotine dependence: Secondary | ICD-10-CM | POA: Insufficient documentation

## 2018-11-05 DIAGNOSIS — M79605 Pain in left leg: Secondary | ICD-10-CM | POA: Insufficient documentation

## 2018-11-05 MED ORDER — CYCLOBENZAPRINE HCL 10 MG PO TABS: 10.0000 mg | ORAL_TABLET | Freq: Two times a day (BID) | ORAL | 0 refills | Status: DC | PRN

## 2018-11-05 MED ORDER — NAPROXEN 500 MG PO TABS: 500.0000 mg | ORAL_TABLET | Freq: Two times a day (BID) | ORAL | 0 refills | Status: DC
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
## 2018-11-05 NOTE — ED Notes (Signed)
NAD at this time. Pt is stable and going home.  

## 2020-04-22 ENCOUNTER — Encounter (HOSPITAL_BASED_OUTPATIENT_CLINIC_OR_DEPARTMENT_OTHER): Payer: Self-pay | Admitting: Emergency Medicine

## 2020-04-22 ENCOUNTER — Emergency Department (HOSPITAL_BASED_OUTPATIENT_CLINIC_OR_DEPARTMENT_OTHER)
Admission: EM | Admit: 2020-04-22 | Discharge: 2020-04-22 | Disposition: A | Discharge status: Home / Self Care | Payer: Self-pay | Attending: Emergency Medicine | Admitting: Emergency Medicine

## 2020-04-22 ENCOUNTER — Other Ambulatory Visit: Payer: Self-pay

## 2020-04-22 ENCOUNTER — Emergency Department (HOSPITAL_BASED_OUTPATIENT_CLINIC_OR_DEPARTMENT_OTHER): Payer: Self-pay

## 2020-04-22 DIAGNOSIS — Z87891 Personal history of nicotine dependence: Secondary | ICD-10-CM | POA: Insufficient documentation

## 2020-04-22 DIAGNOSIS — O9A211 Injury, poisoning and certain other consequences of external causes complicating pregnancy, first trimester: Secondary | ICD-10-CM | POA: Insufficient documentation

## 2020-04-22 DIAGNOSIS — M791 Myalgia, unspecified site: Secondary | ICD-10-CM | POA: Insufficient documentation

## 2020-04-22 DIAGNOSIS — Z3A01 Less than 8 weeks gestation of pregnancy: Secondary | ICD-10-CM | POA: Insufficient documentation

## 2020-04-22 DIAGNOSIS — Z79899 Other long term (current) drug therapy: Secondary | ICD-10-CM | POA: Insufficient documentation

## 2020-04-22 DIAGNOSIS — R102 Pelvic and perineal pain: Secondary | ICD-10-CM | POA: Insufficient documentation

## 2020-04-22 DIAGNOSIS — M549 Dorsalgia, unspecified: Secondary | ICD-10-CM | POA: Insufficient documentation

## 2020-04-22 DIAGNOSIS — O3680X0 Pregnancy with inconclusive fetal viability, not applicable or unspecified: Secondary | ICD-10-CM | POA: Insufficient documentation

## 2020-04-22 DIAGNOSIS — N939 Abnormal uterine and vaginal bleeding, unspecified: Secondary | ICD-10-CM

## 2020-04-22 DIAGNOSIS — O209 Hemorrhage in early pregnancy, unspecified: Secondary | ICD-10-CM | POA: Insufficient documentation

## 2020-04-22 DIAGNOSIS — R1030 Lower abdominal pain, unspecified: Secondary | ICD-10-CM | POA: Insufficient documentation

## 2020-04-22 LAB — ABO/RH: ABO/RH(D): A POS

## 2020-04-22 LAB — HCG, QUANTITATIVE, PREGNANCY: hCG, Beta Chain, Quant, S: 549 m[IU]/mL — ABNORMAL HIGH (ref <5)
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
## 2020-04-22 NOTE — Discharge Instructions (Signed)
If you cannot schedule an appointment in our women's health clinic in 2-3 days, you should go to the MAU at the address above in 48-72 hours to have repeat blood testing.    Your ultrasound today did not show a fetus inside of your uterus.  It is very likely you had a miscarriage yesterday or today, which is common in early pregnancy.  However, it is also possible that you have a very early pregnancy that cannot be seen yet.  It is also possible, though much rarer, that you have a pregnancy outside of your uterus, called an "ectopic pregnancy."  This can potentially become life threatening.  Therefore it is very important that you follow up as advised for repeat bloodwork and evaluation.
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
## 2020-04-25 ENCOUNTER — Encounter (HOSPITAL_COMMUNITY): Payer: Self-pay | Admitting: Obstetrics & Gynecology

## 2020-04-25 ENCOUNTER — Inpatient Hospital Stay (HOSPITAL_COMMUNITY)
Admission: AD | Admit: 2020-04-25 | Discharge: 2020-04-25 | Disposition: A | Discharge status: Home / Self Care | Payer: Self-pay | Attending: Obstetrics & Gynecology | Admitting: Obstetrics & Gynecology

## 2020-04-25 ENCOUNTER — Other Ambulatory Visit: Payer: Self-pay

## 2020-04-25 DIAGNOSIS — O26891 Other specified pregnancy related conditions, first trimester: Secondary | ICD-10-CM

## 2020-04-25 DIAGNOSIS — R109 Unspecified abdominal pain: Secondary | ICD-10-CM

## 2020-04-25 DIAGNOSIS — O209 Hemorrhage in early pregnancy, unspecified: Secondary | ICD-10-CM | POA: Insufficient documentation

## 2020-04-25 DIAGNOSIS — Z3A01 Less than 8 weeks gestation of pregnancy: Secondary | ICD-10-CM | POA: Insufficient documentation

## 2020-04-25 DIAGNOSIS — O3680X0 Pregnancy with inconclusive fetal viability, not applicable or unspecified: Secondary | ICD-10-CM | POA: Insufficient documentation

## 2020-04-25 DIAGNOSIS — O4690 Antepartum hemorrhage, unspecified, unspecified trimester: Secondary | ICD-10-CM

## 2020-04-25 LAB — HCG, QUANTITATIVE, PREGNANCY: hCG, Beta Chain, Quant, S: 1337 m[IU]/mL — ABNORMAL HIGH (ref <5)

## 2020-04-25 NOTE — Discharge Instructions (Signed)
Abdominal Pain During Pregnancy  Abdominal pain is common during pregnancy, and has many possible causes. Some causes are more serious than others, and sometimes the cause is not known. Abdominal pain can be a sign that labor is starting. It can also be caused by normal growth and stretching of muscles and ligaments during pregnancy. Always tell your health care provider if you have any abdominal pain. Follow these instructions at home:  Do not have sex or put anything in your vagina until your pain goes away completely.  Get plenty of rest until your pain improves.  Drink enough fluid to keep your urine pale yellow.  Take over-the-counter and prescription medicines only as told by your health care provider.  Keep all follow-up visits as told by your health care provider. This is important. Contact a health care provider if:  Your pain continues or gets worse after resting.  You have lower abdominal pain that: ? Comes and goes at regular intervals. ? Spreads to your back. ? Is similar to menstrual cramps.  You have pain or burning when you urinate. Get help right away if:  You have a fever or chills.  You have vaginal bleeding.  You are leaking fluid from your vagina.  You are passing tissue from your vagina.  You have vomiting or diarrhea that lasts for more than 24 hours.  Your baby is moving less than usual.  You feel very weak or faint.  You have shortness of breath.  You develop severe pain in your upper abdomen. Summary  Abdominal pain is common during pregnancy, and has many possible causes.  If you experience abdominal pain during pregnancy, tell your health care provider right away.  Follow your health care provider's home care instructions and keep all follow-up visits as directed. This information is not intended to replace advice given to you by your health care provider. Make sure you discuss any questions you have with your health care  provider. Document Revised: 03/31/2019 Document Reviewed: 03/15/2017 Elsevier Patient Education  2020 [employer]  

## 2020-04-25 NOTE — MAU Provider Note (Addendum)
Subjective:  Lisa Fry is a 29 y.o. [phone] at [redacted]w[redacted]d who presents today for FU BHCG. She was seen on 04/22/2020. Results from that day show no IUP on [place], and HCG 549. She reports continued dark vaginal bleeding. She reports abdominal pain all over. He symptoms are not worse than previous visit 48 hours ago.   Objective:  Physical Exam  Nursing note and vitals reviewed. Constitutional: She is oriented to person, place, and time. She appears well-developed and well-nourished. No distress.  HENT:  Head: Normocephalic.  Cardiovascular: Normal rate.  Respiratory: Effort normal.  GI: Soft. There is no tenderness.  Neurological: She is alert and oriented to person, place, and time. Skin: Skin is warm and dry.  Psychiatric: She has a normal mood and affect.  Abdomen: soft, non-tender. No rebound or guarding.   Results for orders placed or performed during the hospital encounter of 04/25/20 (from the past 24 hour(s))  hCG, quantitative, pregnancy     Status: Abnormal   Collection Time: 04/25/20 12:39 PM  Result Value Ref Range   hCG, Beta Chain, Quant, S 1,337 (H) <5 mIU/mL    Assessment/Plan: Pregnancy of unknown location HCG did rise appropriately Strict ectopic precautions Serial quants recommend, given she is still having pain and bleeding.  High priority message sent to Office for f/u Tuesday.  FU in 48 hours A positive blood type.   Duane Lope, NP 04/25/2020 1:58 PM

## 2020-04-25 NOTE — MAU Note (Signed)
Lisa Fry is a 29 y.o. at [redacted]w[redacted]d here in MAU reporting: was at [facility] the other day, positive UPT, vaginal bleeding and pain. They requested that she follow up here in MAU today for repeat hcg. Still bleeding, states bleeding has increased a little bit, is wearing a pad (has not had to change it today). Still having abdominal pain, same as the other day at the ED.  LMP: 03/14/20  Onset of complaint: ongoing  Pain score: 5/10  Vitals:   04/25/20 1217  BP: 127/78  Pulse: 70  Resp: 16  Temp: 98.8 F (37.1 C)  SpO2: 100%     Lab orders placed from triage: none, will talk to provider

## 2020-04-29 ENCOUNTER — Encounter (HOSPITAL_COMMUNITY): Payer: Self-pay | Admitting: Obstetrics and Gynecology

## 2020-04-29 ENCOUNTER — Encounter (HOSPITAL_COMMUNITY)
Admission: AD | Disposition: A | Discharge status: Home / Self Care | Payer: Self-pay | Source: Home / Self Care | Attending: Obstetrics and Gynecology

## 2020-04-29 ENCOUNTER — Inpatient Hospital Stay (HOSPITAL_COMMUNITY): Payer: Self-pay

## 2020-04-29 ENCOUNTER — Ambulatory Visit (HOSPITAL_COMMUNITY)
Admission: AD | Admit: 2020-04-29 | Discharge: 2020-04-30 | Disposition: A | Discharge status: Home / Self Care | Payer: Self-pay | Attending: Obstetrics and Gynecology | Admitting: Obstetrics and Gynecology

## 2020-04-29 ENCOUNTER — Other Ambulatory Visit: Payer: Self-pay

## 2020-04-29 ENCOUNTER — Inpatient Hospital Stay (HOSPITAL_COMMUNITY)
Admission: AD | Admit: 2020-04-29 | Discharge: 2020-04-29 | Discharge status: Against Medical Advice | Payer: Self-pay | Attending: Obstetrics and Gynecology | Admitting: Obstetrics and Gynecology

## 2020-04-29 DIAGNOSIS — Z6790 Unspecified blood type, Rh positive: Secondary | ICD-10-CM

## 2020-04-29 DIAGNOSIS — Z20822 Contact with and (suspected) exposure to covid-19: Secondary | ICD-10-CM | POA: Insufficient documentation

## 2020-04-29 DIAGNOSIS — O00201 Right ovarian pregnancy without intrauterine pregnancy: Secondary | ICD-10-CM

## 2020-04-29 DIAGNOSIS — O009 Unspecified ectopic pregnancy without intrauterine pregnancy: Secondary | ICD-10-CM | POA: Insufficient documentation

## 2020-04-29 DIAGNOSIS — O4690 Antepartum hemorrhage, unspecified, unspecified trimester: Secondary | ICD-10-CM

## 2020-04-29 DIAGNOSIS — O00101 Right tubal pregnancy without intrauterine pregnancy: Secondary | ICD-10-CM | POA: Insufficient documentation

## 2020-04-29 DIAGNOSIS — R1011 Right upper quadrant pain: Secondary | ICD-10-CM

## 2020-04-29 DIAGNOSIS — Z79899 Other long term (current) drug therapy: Secondary | ICD-10-CM | POA: Insufficient documentation

## 2020-04-29 DIAGNOSIS — Z3A01 Less than 8 weeks gestation of pregnancy: Secondary | ICD-10-CM | POA: Insufficient documentation

## 2020-04-29 DIAGNOSIS — Z87891 Personal history of nicotine dependence: Secondary | ICD-10-CM | POA: Insufficient documentation

## 2020-04-29 HISTORY — PX: LAPAROSCOPIC UNILATERAL SALPINGECTOMY: SHX5934

## 2020-04-29 LAB — URINALYSIS, ROUTINE W REFLEX MICROSCOPIC
Bilirubin Urine: NEGATIVE
Glucose, UA: NEGATIVE mg/dL
Hgb urine dipstick: NEGATIVE
Ketones, ur: NEGATIVE mg/dL
Nitrite: NEGATIVE
Protein, ur: NEGATIVE mg/dL
Specific Gravity, Urine: 1.024 (ref 1.005–1.030)
pH: 6 (ref 5.0–8.0)

## 2020-04-29 LAB — TYPE AND SCREEN
ABO/RH(D): A POS
Antibody Screen: NEGATIVE

## 2020-04-29 LAB — COMPREHENSIVE METABOLIC PANEL
ALT: 13 U/L (ref 0–44)
AST: 15 U/L (ref 15–41)
Albumin: 3.2 g/dL — ABNORMAL LOW (ref 3.5–5.0)
Alkaline Phosphatase: 46 U/L (ref 38–126)
Anion gap: 9 (ref 5–15)
BUN: 11 mg/dL (ref 6–20)
CO2: 25 mmol/L (ref 22–32)
Calcium: 8.7 mg/dL — ABNORMAL LOW (ref 8.9–10.3)
Chloride: 102 mmol/L (ref 98–111)
Creatinine, Ser: 0.79 mg/dL (ref 0.44–1.00)
GFR calc Af Amer: >60 mL/min (ref >60)
GFR calc non Af Amer: >60 mL/min (ref >60)
Glucose, Bld: 86 mg/dL (ref 70–99)
Potassium: 4.3 mmol/L (ref 3.5–5.1)
Sodium: 136 mmol/L (ref 135–145)
Total Bilirubin: 0.4 mg/dL (ref 0.3–1.2)
Total Protein: 6.7 g/dL (ref 6.5–8.1)

## 2020-04-29 LAB — RESPIRATORY PANEL BY RT PCR (FLU A&B, COVID)
Influenza A by PCR: NEGATIVE
Influenza B by PCR: NEGATIVE
SARS Coronavirus 2 by RT PCR: NEGATIVE

## 2020-04-29 LAB — CBC
HCT: 35.4 % — ABNORMAL LOW (ref 36.0–46.0)
Hemoglobin: 11.3 g/dL — ABNORMAL LOW (ref 12.0–15.0)
MCH: 29.1 pg (ref 26.0–34.0)
MCHC: 31.9 g/dL (ref 30.0–36.0)
MCV: 91.2 fL (ref 80.0–100.0)
Platelets: 205 10*3/uL (ref 150–400)
RBC: 3.88 MIL/uL (ref 3.87–5.11)
RDW: 13.6 % (ref 11.5–15.5)
WBC: 6.3 10*3/uL (ref 4.0–10.5)
nRBC: 0 % (ref 0.0–0.2)

## 2020-04-29 LAB — HCG, QUANTITATIVE, PREGNANCY: hCG, Beta Chain, Quant, S: 2482 m[IU]/mL — ABNORMAL HIGH (ref <5)

## 2020-04-29 SURGERY — SALPINGECTOMY, UNILATERAL, LAPAROSCOPIC
Anesthesia: General | Laterality: Right

## 2020-04-29 MED ORDER — SODIUM CHLORIDE 0.9 % IR SOLN
Status: DC | PRN
  Administered 2020-04-29: 1000 mL

## 2020-04-29 MED ORDER — LACTATED RINGERS IV SOLN: INTRAVENOUS | Status: DC

## 2020-04-29 MED ORDER — SODIUM CHLORIDE 0.9% FLUSH: 10.0000 mL | Freq: Two times a day (BID) | INTRAVENOUS | Status: DC

## 2020-04-29 MED ORDER — MIDAZOLAM HCL 2 MG/2ML IJ SOLN
INTRAMUSCULAR | Status: AC
  Filled 2020-04-29: qty 2

## 2020-04-29 MED ORDER — PROPOFOL 10 MG/ML IV BOLUS
INTRAVENOUS | Status: AC
  Filled 2020-04-29: qty 20

## 2020-04-29 MED ORDER — FENTANYL CITRATE (PF) 250 MCG/5ML IJ SOLN
INTRAMUSCULAR | Status: AC
  Filled 2020-04-29: qty 5

## 2020-04-29 MED ORDER — SODIUM CHLORIDE 0.9% FLUSH: 10.0000 mL | INTRAVENOUS | Status: DC | PRN

## 2020-04-29 SURGICAL SUPPLY — 28 items
DRSG OPSITE POSTOP 3X4 (GAUZE/BANDAGES/DRESSINGS) ×2 IMPLANT
DURAPREP 26ML APPLICATOR (WOUND CARE) ×2 IMPLANT
ELECT PAD GROUND ADT 9 (MISCELLANEOUS) ×2 IMPLANT
GLOVE BIO SURGEON STRL SZ 6 (GLOVE) ×2 IMPLANT
GLOVE BIOGEL PI IND STRL 6 (GLOVE) ×1 IMPLANT
GLOVE BIOGEL PI IND STRL 7.0 (GLOVE) ×1 IMPLANT
GLOVE BIOGEL PI INDICATOR 6 (GLOVE) ×1
GLOVE BIOGEL PI INDICATOR 7.0 (GLOVE) ×1
GLOVE ECLIPSE 7.0 STRL STRAW (GLOVE) ×2 IMPLANT
GOWN STRL REUS W/ TWL LRG LVL3 (GOWN DISPOSABLE) ×2 IMPLANT
GOWN STRL REUS W/TWL LRG LVL3 (GOWN DISPOSABLE) ×2
KIT TURNOVER KIT B (KITS) ×2 IMPLANT
LIGASURE VESSEL 5MM BLUNT TIP (ELECTROSURGICAL) ×2 IMPLANT
PACK LAPAROSCOPY BASIN (CUSTOM PROCEDURE TRAY) ×2 IMPLANT
PACK TRENDGUARD 450 HYBRID PRO (MISCELLANEOUS) ×1 IMPLANT
POUCH SPECIMEN RETRIEVAL 10MM (ENDOMECHANICALS) ×2 IMPLANT
PROTECTOR NERVE ULNAR (MISCELLANEOUS) ×4 IMPLANT
SET IRRIG TUBING LAPAROSCOPIC (IRRIGATION / IRRIGATOR) ×2 IMPLANT
SET TUBE SMOKE EVAC HIGH FLOW (TUBING) ×2 IMPLANT
SLEEVE ENDOPATH XCEL 5M (ENDOMECHANICALS) ×2 IMPLANT
SUT MNCRL AB 4-0 PS2 18 (SUTURE) ×2 IMPLANT
SUT VICRYL 0 UR6 27IN ABS (SUTURE) ×4 IMPLANT
TOWEL GREEN STERILE FF (TOWEL DISPOSABLE) ×4 IMPLANT
TRAY FOLEY W/BAG SLVR 14FR (SET/KITS/TRAYS/PACK) ×2 IMPLANT
TRENDGUARD 450 HYBRID PRO PACK (MISCELLANEOUS) ×2
TROCAR BALLN 12MMX100 BLUNT (TROCAR) ×2 IMPLANT
TROCAR XCEL NON-BLD 5MMX100MML (ENDOMECHANICALS) ×2 IMPLANT
WARMER LAPAROSCOPE (MISCELLANEOUS) ×2 IMPLANT

## 2020-04-29 NOTE — H&P (Signed)
Lisa Fry is an 29 y.o. female. [id number] at [redacted]w[redacted]d with apparent right ectopic pregnancy by imaging diagnosed in MAU earlier today.  She left AMA to ensure child care then returns for treatment.  She is currently having right sided pelvic pain and some bleeding.  She does not want methotrexate and desires surgical management for her ectopic pregnancy.  She has no other symptoms or concerns at this time.  She last ate a hot dog and slushie at 1300 today.   Past Medical History:  Diagnosis Date  . Cyst of ovary     Past Surgical History:  Procedure Laterality Date  . LEG SURGERY Left   . LIPOSUCTION AUOLOGOUS FAT TRANSFER TO BUTTOCKS    . UMBILICAL HERNIA REPAIR      Family History  Problem Relation Age of Onset  . Healthy Mother   . HIV Father     Social History:  reports that she has quit smoking. Her smoking use included cigarettes. She has never used smokeless tobacco. She reports previous alcohol use. She reports that she does not use drugs.  Allergies: No Known Allergies  Medications Prior to Admission  Medication Sig Dispense Refill Last Dose  . cetirizine (ZYRTEC) 10 MG tablet Take 10 mg by mouth daily.   04/29/2020 at Unknown time  . fluticasone (FLONASE) 50 MCG/ACT nasal spray Place 1 spray into both nostrils daily. 9.9 g 2 04/29/2020 at Unknown time  . Multiple Vitamin (MULTIVITAMIN WITH MINERALS) TABS tablet Take 1 tablet by mouth daily.   04/29/2020 at Unknown time  . omeprazole (PRILOSEC) 20 MG capsule Take 1 capsule (20 mg total) by mouth daily. (Patient not taking: Reported on 03/17/2018) 30 capsule 1     Review of Systems  Gastrointestinal: Positive for abdominal pain.  Genitourinary: Positive for vaginal bleeding.  All other systems reviewed and are negative.   Last menstrual period 03/14/2020. Physical Exam  Nursing note and vitals reviewed. Constitutional: She is oriented to person, place, and time. She appears well-developed and well-nourished.  HENT:   Head: Normocephalic and atraumatic.  Eyes: Conjunctivae and EOM are normal. No scleral icterus.  Respiratory: Effort normal. No respiratory distress.  GI: Soft. She exhibits no distension. There is abdominal tenderness.  Musculoskeletal:        General: No edema. Normal range of motion.     Cervical back: Normal range of motion and neck supple.  Neurological: She is alert and oriented to person, place, and time.  Skin: Skin is warm and dry.  Psychiatric: She has a normal mood and affect. Her behavior is normal. Judgment and thought content normal.    Results for orders placed or performed during the hospital encounter of 04/29/20 (from the past 24 hour(s))  Urinalysis, Routine w reflex microscopic     Status: Abnormal   Collection Time: 04/29/20  1:44 PM  Result Value Ref Range   Color, Urine YELLOW YELLOW   APPearance CLEAR CLEAR   Specific Gravity, Urine 1.024 1.005 - 1.030   pH 6.0 5.0 - 8.0   Glucose, UA NEGATIVE NEGATIVE mg/dL   Hgb urine dipstick NEGATIVE NEGATIVE   Bilirubin Urine NEGATIVE NEGATIVE   Ketones, ur NEGATIVE NEGATIVE mg/dL   Protein, ur NEGATIVE NEGATIVE mg/dL   Nitrite NEGATIVE NEGATIVE   Leukocytes,Ua SMALL (A) NEGATIVE   RBC / HPF 0-5 0 - 5 RBC/hpf   WBC, UA 11-20 0 - 5 WBC/hpf   Bacteria, UA RARE (A) NONE SEEN   Squamous Epithelial / LPF 0-5 0 -  5   Mucus PRESENT   CBC     Status: Abnormal   Collection Time: 04/29/20  2:08 PM  Result Value Ref Range   WBC 6.3 4.0 - 10.5 K/uL   RBC 3.88 3.87 - 5.11 MIL/uL   Hemoglobin 11.3 (L) 12.0 - 15.0 g/dL   HCT 38.7 (L) 56.4 - 33.2 %   MCV 91.2 80.0 - 100.0 fL   MCH 29.1 26.0 - 34.0 pg   MCHC 31.9 30.0 - 36.0 g/dL   RDW 95.1 88.4 - 16.6 %   Platelets 205 150 - 400 K/uL   nRBC 0.0 0.0 - 0.2 %  Comprehensive metabolic panel     Status: Abnormal   Collection Time: 04/29/20  2:08 PM  Result Value Ref Range   Sodium 136 135 - 145 mmol/L   Potassium 4.3 3.5 - 5.1 mmol/L   Chloride 102 98 - 111 mmol/L   CO2 25  22 - 32 mmol/L   Glucose, Bld 86 70 - 99 mg/dL   BUN 11 6 - 20 mg/dL   Creatinine, Ser 0.63 0.44 - 1.00 mg/dL   Calcium 8.7 (L) 8.9 - 10.3 mg/dL   Total Protein 6.7 6.5 - 8.1 g/dL   Albumin 3.2 (L) 3.5 - 5.0 g/dL   AST 15 15 - 41 U/L   ALT 13 0 - 44 U/L   Alkaline Phosphatase 46 38 - 126 U/L   Total Bilirubin 0.4 0.3 - 1.2 mg/dL   GFR calc non Af Amer >60 >60 mL/min   GFR calc Af Amer >60 >60 mL/min   Anion gap 9 5 - 15  hCG, quantitative, pregnancy     Status: Abnormal   Collection Time: 04/29/20  2:08 PM  Result Value Ref Range   hCG, Beta Chain, Quant, S 2,482 (H) <5 mIU/mL    US OB Transvaginal  Result Date: 04/29/2020 CLINICAL DATA:  Pelvic pain and vaginal bleeding. 1st trimester pregnancy of unknown anatomic location. EXAM: TRANSVAGINAL OB ULTRASOUND TECHNIQUE: Transvaginal ultrasound was performed for complete evaluation of the gestation as well as the maternal uterus, adnexal regions, and pelvic cul-de-sac. COMPARISON:  04/22/2020 FINDINGS: Intrauterine gestational sac: None Maternal uterus/adnexae: No fibroids identified. Both ovaries are normal in appearance. Now seen in the right adnexal region separate from the ovary is a mass which contains a central sac like area with echogenic rim. This measures 2.6 x 2.6 x 1.8 cm, and is consistent with ectopic pregnancy. Small amount of simple free fluid noted in pelvic cul-de-sac. IMPRESSION: No IUP visualized. 2.6 cm right adnexal mass now seen, consistent with ectopic pregnancy. Small amount of simple free fluid in cul-de-sac. Critical Value/emergent results were called by telephone at the time of interpretation on 04/29/2020 at 4:35 pm to the patient's MAU nurse French Ana, who verbally acknowledged these results. Electronically Signed   By: Danae Orleans M.D.   On: 04/29/2020 16:37   US Abdomen Limited RUQ  Result Date: 04/29/2020 CLINICAL DATA:  Upper abdominal pain EXAM: ULTRASOUND ABDOMEN LIMITED RIGHT UPPER QUADRANT COMPARISON:  None.  FINDINGS: Gallbladder: There is a 4 mm echogenic focus along the posterior wall of the gallbladder which neither moves nor shadows, an apparent small polyp. There are no echogenic foci in the gallbladder which move and shadow as is expected with cholelithiasis. No gallbladder wall thickening or pericholecystic fluid. No sonographic Murphy sign noted by sonographer. Common bile duct: Diameter: 3 mm. No intrahepatic or extrahepatic biliary duct dilatation. Liver: No focal lesion identified. Within normal limits  in parenchymal echogenicity. Portal vein is patent on color Doppler imaging with normal direction of blood flow towards the liver. Other: None. IMPRESSION: 4 mm apparent gallbladder polyp. Per consensus guidelines, a polyp of this size does not warrant additional imaging surveillance. No evident gallstones, gallbladder wall thickening, or pericholecystic fluid. Study otherwise unremarkable. Electronically Signed   By: Bretta Bang III M.D.   On: 04/29/2020 16:18    Assessment/Plan: 29 yo [id number] with apparent right ectopic pregnancy -- Discussed with patient options of methotrexate vs surgical management.  She strongly desires surgical management.  Discussed plan of diagnostic laparoscopy and probably salpingectomy.  Discussed surgical risks of bleeding, infection, injury to bowels, bladder or other structures, risk of conversion to open or unexpected findings requiring additional procedures.  -- Patient initially stated no previous surgery, then recalls childhood hernia repair at umbilicus.  Records unavailable and incision small.  Discussed trial of entry at the umbilicus, and if unsuccessful may require LUQ entry as degree of adhesions are uncertain and patient not able to describe previous history well.  However no laparotomy scar noted.  -- A positive.  T&S and COVID test pending.  Will proceed when results return and OR available.  Patient is currently stable.  Continue to monitor.  Catalina Pizza 04/29/2020, 8:50 PM

## 2020-04-29 NOTE — Anesthesia Preprocedure Evaluation (Addendum)
Anesthesia Evaluation  Patient identified by MRN, date of birth, ID band Patient awake    Reviewed: Allergy & Precautions  Airway Mallampati: II  TM Distance: >3 FB     Dental   Pulmonary former smoker,    breath sounds clear to auscultation       Cardiovascular negative cardio ROS   Rhythm:Regular Rate:Normal     Neuro/Psych    GI/Hepatic negative GI ROS, Neg liver ROS, History  Noted. CG   Endo/Other  negative endocrine ROS  Renal/GU negative Renal ROS     Musculoskeletal   Abdominal   Peds  Hematology   Anesthesia Other Findings   Reproductive/Obstetrics                            Anesthesia Physical Anesthesia Plan  ASA: I and emergent  Anesthesia Plan: General   Post-op Pain Management:    Induction: Intravenous  PONV Risk Score and Plan: 3 and Ondansetron, Dexamethasone and Midazolam  Airway Management Planned: Oral ETT  Additional Equipment:   Intra-op Plan:   Post-operative Plan: Possible Post-op intubation/ventilation  Informed Consent: I have reviewed the patients History and Physical, chart, labs and discussed the procedure including the risks, benefits and alternatives for the proposed anesthesia with the patient or authorized representative who has indicated his/her understanding and acceptance.     Dental advisory given  Plan Discussed with: CRNA and Anesthesiologist  Anesthesia Plan Comments:         Anesthesia Quick Evaluation

## 2020-04-29 NOTE — MAU Note (Signed)
Pt left AMA earlier due to child care and has returned for ectopic pregnancy treatment.
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
## 2020-04-29 NOTE — MAU Note (Signed)
Having pain in lower abd still, both sides.  Cramps, hurts - feels like she is going to go in to labor.  Still bleeding, comes out when she urinates.

## 2020-04-30 ENCOUNTER — Inpatient Hospital Stay (HOSPITAL_COMMUNITY): Payer: Self-pay | Admitting: Anesthesiology

## 2020-04-30 DIAGNOSIS — O00101 Right tubal pregnancy without intrauterine pregnancy: Secondary | ICD-10-CM

## 2020-04-30 LAB — GC/CHLAMYDIA PROBE AMP (~~LOC~~) NOT AT ARMC
Chlamydia: POSITIVE — AB
Comment: NEGATIVE
Comment: NORMAL
Neisseria Gonorrhea: NEGATIVE

## 2020-04-30 LAB — CULTURE, OB URINE: Culture: NO GROWTH

## 2020-04-30 LAB — ABO/RH: ABO/RH(D): A POS

## 2020-04-30 MED ORDER — ONDANSETRON HCL 4 MG/2ML IJ SOLN
INTRAMUSCULAR | Status: AC
  Filled 2020-04-30: qty 2

## 2020-04-30 MED ORDER — FENTANYL CITRATE (PF) 100 MCG/2ML IJ SOLN
INTRAMUSCULAR | Status: AC
  Filled 2020-04-30: qty 2

## 2020-04-30 MED ORDER — FENTANYL CITRATE (PF) 100 MCG/2ML IJ SOLN
INTRAMUSCULAR | Status: DC | PRN
  Administered 2020-04-30: 50 ug via INTRAVENOUS
  Administered 2020-04-30 (×2): 100 ug via INTRAVENOUS

## 2020-04-30 MED ORDER — DEXAMETHASONE SODIUM PHOSPHATE 4 MG/ML IJ SOLN
INTRAMUSCULAR | Status: DC | PRN
  Administered 2020-04-30: 10 mg via INTRAVENOUS

## 2020-04-30 MED ORDER — LIDOCAINE HCL (CARDIAC) PF 100 MG/5ML IV SOSY
PREFILLED_SYRINGE | INTRAVENOUS | Status: DC | PRN
  Administered 2020-04-30: 30 mg via INTRAVENOUS

## 2020-04-30 MED ORDER — PROPOFOL 10 MG/ML IV BOLUS
INTRAVENOUS | Status: DC | PRN
  Administered 2020-04-30: 200 mg via INTRAVENOUS

## 2020-04-30 MED ORDER — ROCURONIUM BROMIDE 100 MG/10ML IV SOLN
INTRAVENOUS | Status: DC | PRN
  Administered 2020-04-30: 5 mg via INTRAVENOUS
  Administered 2020-04-30: 20 mg via INTRAVENOUS

## 2020-04-30 MED ORDER — IBUPROFEN 800 MG PO TABS: 800.0000 mg | ORAL_TABLET | Freq: Three times a day (TID) | ORAL | 0 refills | Status: DC | PRN

## 2020-04-30 MED ORDER — BUPIVACAINE HCL (PF) 0.25 % IJ SOLN
INTRAMUSCULAR | Status: DC | PRN
  Administered 2020-04-30: 30 mL

## 2020-04-30 MED ORDER — ROCURONIUM BROMIDE 10 MG/ML (PF) SYRINGE
PREFILLED_SYRINGE | INTRAVENOUS | Status: AC
  Filled 2020-04-30: qty 10

## 2020-04-30 MED ORDER — SUCCINYLCHOLINE CHLORIDE 200 MG/10ML IV SOSY
PREFILLED_SYRINGE | INTRAVENOUS | Status: AC
  Filled 2020-04-30: qty 10

## 2020-04-30 MED ORDER — FENTANYL CITRATE (PF) 100 MCG/2ML IJ SOLN
25.0000 ug | INTRAMUSCULAR | Status: DC | PRN
  Administered 2020-04-30 (×3): 50 ug via INTRAVENOUS

## 2020-04-30 MED ORDER — DEXAMETHASONE SODIUM PHOSPHATE 10 MG/ML IJ SOLN
INTRAMUSCULAR | Status: AC
  Filled 2020-04-30: qty 1

## 2020-04-30 MED ORDER — KETOROLAC TROMETHAMINE 30 MG/ML IJ SOLN
INTRAMUSCULAR | Status: DC | PRN
  Administered 2020-04-30: 30 mg via INTRAVENOUS

## 2020-04-30 MED ORDER — MIDAZOLAM HCL 5 MG/5ML IJ SOLN
INTRAMUSCULAR | Status: DC | PRN
  Administered 2020-04-30: 2 mg via INTRAVENOUS

## 2020-04-30 MED ORDER — SUGAMMADEX SODIUM 200 MG/2ML IV SOLN
INTRAVENOUS | Status: DC | PRN
  Administered 2020-04-30: 200 mg via INTRAVENOUS

## 2020-04-30 MED ORDER — HYDROCODONE-ACETAMINOPHEN 5-325 MG PO TABS: 1.0000 | ORAL_TABLET | Freq: Four times a day (QID) | ORAL | 0 refills | Status: DC | PRN

## 2020-04-30 MED ORDER — ONDANSETRON HCL 4 MG/2ML IJ SOLN
INTRAMUSCULAR | Status: DC | PRN
  Administered 2020-04-30: 4 mg via INTRAVENOUS

## 2020-04-30 NOTE — MAU Note (Signed)
Pt transported to [facility]- Main OR.

## 2020-04-30 NOTE — Anesthesia Procedure Notes (Signed)
Procedure Name: Intubation Date/Time: 04/30/2020 12:32 AM Performed by: Alvy Bimler, CRNA Pre-anesthesia Checklist: Patient identified, Patient being monitored, Timeout performed, Emergency Drugs available and Suction available Patient Re-evaluated:Patient Re-evaluated prior to induction Oxygen Delivery Method: Circle System Utilized and Circle system utilized Preoxygenation: Pre-oxygenation with 100% oxygen Induction Type: IV induction, Rapid sequence and Cricoid Pressure applied Laryngoscope Size: Mac and 3 Grade View: Grade II Tube type: Oral Tube size: 7.0 mm Number of attempts: 1 Airway Equipment and Method: stylet and Stylet Placement Confirmation: ETT inserted through vocal cords under direct vision,  positive ETCO2 and breath sounds checked- equal and bilateral Secured at: 21 cm Tube secured with: Tape Dental Injury: Teeth and Oropharynx as per pre-operative assessment

## 2020-04-30 NOTE — Anesthesia Postprocedure Evaluation (Signed)
Anesthesia Post Note  Patient: Lisa Fry  Procedure(s) Performed: LAPAROSCOPIC UNILATERAL SALPINGECTOMY WITH REMOVAL OF ECTOPIC PREGNANCY (Right )     Patient location during evaluation: PACU Anesthesia Type: General Level of consciousness: awake Pain management: pain level controlled Vital Signs Assessment: post-procedure vital signs reviewed and stable Respiratory status: spontaneous breathing Cardiovascular status: stable Postop Assessment: no apparent nausea or vomiting Anesthetic complications: no    Last Vitals:  Vitals:   04/30/20 0129 04/30/20 0145  BP: 130/67 124/70  Pulse: 80 65  Resp: (!) 22 15  Temp: 36.8 C   SpO2: 100% 98%    Last Pain:  Vitals:   04/30/20 0145  TempSrc:   PainSc: 6                  Eron Goble

## 2020-04-30 NOTE — Discharge Instructions (Signed)
Diagnostic Laparoscopy, Care After This sheet gives you information about how to care for yourself after your procedure. Your health care provider may also give you more specific instructions. If you have problems or questions, contact your health care provider. What can I expect after the procedure? After the procedure, it is common to have:  Mild discomfort in the abdomen.  Sore throat. Women who have laparoscopy with pelvic examination may have mild cramping and fluid coming from the vagina for a few days after the procedure. Follow these instructions at home: Medicines  Take over-the-counter and prescription medicines only as told by your health care provider.  If you were prescribed an antibiotic medicine, take it as told by your health care provider. Do not stop taking the antibiotic even if you start to feel better. Driving  Do not drive for 24 hours if you were given a medicine to help you relax (sedative) during your procedure.  Do not drive or use heavy machinery while taking prescription pain medicine. Bathing  Do not take baths, swim, or use a hot tub until your health care provider approves. You may take showers. Incision care   Follow instructions from your health care provider about how to take care of your incisions. Make sure you: ? Wash your hands with soap and water before you change your bandage (dressing). If soap and water are not available, use hand sanitizer. ? Change your dressing as told by your health care provider. ? Leave stitches (sutures), skin glue, or adhesive strips in place. These skin closures may need to stay in place for 2 weeks or longer. If adhesive strip edges start to loosen and curl up, you may trim the loose edges. Do not remove adhesive strips completely unless your health care provider tells you to do that.  Check your incision areas every day for signs of infection. Check for: ? Redness, swelling, or pain. ? Fluid or  blood. ? Warmth. ? Pus or a bad smell. Activity  Return to your normal activities as told by your health care provider. Ask your health care provider what activities are safe for you.  Do not lift anything that is heavier than 10 lb (4.5 kg), or the limit that you are told, until your health care provider says that it is safe. General instructions  To prevent or treat constipation while you are taking prescription pain medicine, your health care provider may recommend that you: ? Drink enough fluid to keep your urine pale yellow. ? Take over-the-counter or prescription medicines. ? Eat foods that are high in fiber, such as fresh fruits and vegetables, whole grains, and beans. ? Limit foods that are high in fat and processed sugars, such as fried and sweet foods.  Do not use any products that contain nicotine or tobacco, such as cigarettes and e-cigarettes. If you need help quitting, ask your health care provider.  Keep all follow-up visits as told by your health care provider. This is important. Contact a health care provider if:  You develop shoulder pain.  You feel lightheaded or faint.  You are unable to pass gas or have a bowel movement.  You feel nauseous or you vomit.  You develop a rash.  You have redness, swelling, or pain around any incision.  You have fluid or blood coming from any incision.  Any incision feels warm to the touch.  You have pus or a bad smell coming from any incision.  You have a fever or chills. Get help   right away if:  You have severe pain.  You have vomiting that does not go away.  You have heavy bleeding from the vagina.  Any incision opens.  You have trouble breathing.  You have chest pain. Summary  After the procedure, it is common to have mild discomfort in the abdomen and a sore throat.  Check your incision areas every day for signs of infection.  Return to your normal activities as told by your health care provider. Ask  your health care provider what activities are safe for you. This information is not intended to replace advice given to you by your health care provider. Make sure you discuss any questions you have with your health care provider. Document Revised: 11/23/2017 Document Reviewed: 06/06/2017 Elsevier Patient Education  2020 [employer]  

## 2020-04-30 NOTE — Transfer of Care (Signed)
Immediate Anesthesia Transfer of Care Note  Patient: Lisa Fry  Procedure(s) Performed: LAPAROSCOPIC UNILATERAL SALPINGECTOMY WITH REMOVAL OF ECTOPIC PREGNANCY (Right )  Patient Location: PACU  Anesthesia Type:General  Level of Consciousness: awake, alert , oriented and patient cooperative  Airway & Oxygen Therapy: Patient Spontanous Breathing and Patient connected to face mask oxygen  Post-op Assessment: Report given to RN, Post -op Vital signs reviewed and stable and Patient moving all extremities X 4  Post vital signs: Reviewed and stable  Last Vitals:  Vitals Value Taken Time  BP 130/67 04/30/20 0131  Temp    Pulse 78 04/30/20 0131  Resp 19 04/30/20 0131  SpO2 100 % 04/30/20 0131  Vitals shown include unvalidated device data.  Last Pain:  Vitals:   04/29/20 2337  TempSrc: Oral  PainSc: 1       Patients Stated Pain Goal: 0 (04/29/20 2028)  Complications: No apparent anesthesia complications

## 2020-04-30 NOTE — Op Note (Signed)
Procedure(s): LAPAROSCOPIC UNILATERAL SALPINGECTOMY WITH REMOVAL OF ECTOPIC PREGNANCY Procedure Note  Lisa Fry female 29 y.o. 04/30/2020  Procedure(s) and Anesthesia Type:    * LAPAROSCOPIC UNILATERAL SALPINGECTOMY WITH REMOVAL OF ECTOPIC PREGNANCY - General  Surgeon(s) and Role:    Catalina Pizza, MD - Primary   Indications: The patient was diagnosed with a right ectopic pregnancy and desired surgical rather than medical management.  She was consented as noted in her H&P.      Surgeon: Catalina Pizza   Assistants: N/A  Anesthesia: General endotracheal anesthesia  ASA Class: 2    Procedure Detail  LAPAROSCOPIC UNILATERAL SALPINGECTOMY WITH REMOVAL OF ECTOPIC PREGNANCY  Findings: Right sided tubal ectopic pregnancy with moderate clot at the tube and some pooling of blood in the posterior cul de sac.  Normal uterus and bilateral ovaries, normal left fallopian tube.  Estimated Blood Loss:  5cc procedural blood loss         Drains: foley out at end of case         Total IV Fluids: 1000 ml  UOP: 50cc of concentrated urine  Blood Given: none          Specimens: right fallopian tube with ectopic pregnancy         Implants: none        Complications:  None         Disposition: PACU - hemodynamically stable.         Condition: stable  Patient was taken to the OR where general anesthesia was induced.  A time out was performed.  SCDs were on and operating.  A foley was placed in sterile conditions.  The patient was positioned in dorsal lithotomy position in Hunker stirrups with both arms tucked and padded at sides and prepped and draped in the normal sterile fashion.    A Hasson incision was made in a vertical fashion immediately inferior to the umbilicus with the scalpel.  The fascia was visualized and grasped with Kochers x 2 then entered sharply and the incision was extended to the full length of the skin access with Mayo scissors.  The peritoneum was grasped  with hemostats x 2 and elevated and entered sharply in a clear area. The Hasson port was then placed and the abdomen insufflated.  Survey revealed the findings above.  25mm ports were placed in the left lower quadrant and left mid quadrant under direct visualization.  The clot and pooled blood was then cleared from the pelvis.  The right tube with ectopic was then elevated and separated from the mesosalpinx and cornual attachments using the ligasure.  The camera was then moved to the left mid quadrant port and the endocatch bag introduced.  The specimen was then placed in the bag and removed through the umbilical port.  Survey revealed excellent hemostasis and the 32mm ports were withdrawn under direct visualization.  The umbilical port was removed and insufflation evacuated.  At this point,count was completed and reported as correct and the umbilical fascia was closed with 0-vicryl suture.  The subcutaneous tissue was inspected and dry. 30cc of 0.5% marcaine were injected into the sites.  The skin was closed with 4-0 monocryl subcuticular stitches and a dermabond dressing was applied.    Final count was reported as correct and patient was stable for transfer to the recovery room.  No complications noted. Patient tolerated procedure well.

## 2020-04-30 NOTE — Progress Notes (Signed)
Pt remains in PACU waiting on ride. Pt has been dressed in wheelchair since 0230. Multiple unsuccessful attempts to reach ride.

## 2020-05-02 ENCOUNTER — Encounter (HOSPITAL_COMMUNITY): Payer: Self-pay | Admitting: *Deleted

## 2020-05-02 ENCOUNTER — Inpatient Hospital Stay (HOSPITAL_COMMUNITY)
Admission: AD | Admit: 2020-05-02 | Discharge: 2020-05-03 | Disposition: A | Discharge status: Home / Self Care | Payer: Self-pay | Attending: Obstetrics & Gynecology | Admitting: Obstetrics & Gynecology

## 2020-05-02 ENCOUNTER — Inpatient Hospital Stay (HOSPITAL_COMMUNITY): Payer: Self-pay

## 2020-05-02 DIAGNOSIS — G8918 Other acute postprocedural pain: Secondary | ICD-10-CM | POA: Insufficient documentation

## 2020-05-02 DIAGNOSIS — Z87891 Personal history of nicotine dependence: Secondary | ICD-10-CM | POA: Insufficient documentation

## 2020-05-02 NOTE — MAU Provider Note (Signed)
Patient Lisa Fry is a 29 y.o. [id number]  at Unknown here with pain after right salpingectomy early Friday morning, 04-30-2020. Lisa Fry also reports some vaginal bleeding, denies any pain with urination.  Lisa Fry denies fever, foul-smelling discharge.    Lisa Fry reports that after Lisa Fry left here, Lisa Fry went home and went to sleep. Lisa Fry reports that Lisa Fry has not been eating and that Lisa Fry has no appetite. Lisa Fry denies nausea and vomiting. Patient has not had bowel movement since Friday. Lisa Fry has also not passed gas since her surgery.   Lisa Fry is able to tolerate liquids and feels that Lisa Fry is urinating normally.  History     CSN: [id number]  Arrival date and time: 05/02/20 2223   None     Chief Complaint  Patient presents with  . Abdominal Pain   Abdominal Pain This is a new (Pain started 24 hours ago) problem. The current episode started yesterday. The onset quality is sudden. The problem occurs intermittently. The pain is located in the LLQ and RLQ. The pain is at a severity of 8/10. The quality of the pain is cramping. Pain radiation: radiates to her legs. Associated symptoms include constipation. Pertinent negatives include no diarrhea, dysuria, fever, nausea or vomiting.  Vaginal Bleeding The patient's primary symptoms include vaginal bleeding. This is a new problem. The current episode started today. The problem occurs intermittently. The problem has been unchanged. Associated symptoms include abdominal pain and constipation. Pertinent negatives include no diarrhea, dysuria, fever, nausea or vomiting. The vaginal discharge was bloody. The vaginal bleeding is lighter than menses. Lisa Fry has not been passing clots. Lisa Fry has not been passing tissue.  The pain comes on every 5 minutes and lasts for about 30 seconds to 2 min. Lisa Fry did not take her pain medicine when Lisa Fry got home on Friday, and Lisa Fry felt ok on Saturday during the day but then had strong pain Saturday night and during the day today.     OB History    Gravida  5   Para  3   Term  3   Preterm  0   AB  1   Living  3     SAB  0   TAB  1   Ectopic  0   Multiple  0   Live Births  3           Past Medical History:  Diagnosis Date  . Cyst of ovary     Past Surgical History:  Procedure Laterality Date  . LAPAROSCOPIC UNILATERAL SALPINGECTOMY Right 04/29/2020   Procedure: LAPAROSCOPIC UNILATERAL SALPINGECTOMY WITH REMOVAL OF ECTOPIC PREGNANCY;  Surgeon: Catalina Pizza, MD;  [facility] OR;  Service: Gynecology;  Laterality: Right;  . LEG SURGERY Left   . LIPOSUCTION AUOLOGOUS FAT TRANSFER TO BUTTOCKS    . UMBILICAL HERNIA REPAIR      Family History  Problem Relation Age of Onset  . Healthy Mother   . HIV Father     Social History   Tobacco Use  . Smoking status: Former Smoker    Types: Cigarettes  . Smokeless tobacco: Never Used  . Tobacco comment: stopped age 44 "a phase"  Substance Use Topics  . Alcohol use: Not Currently    Comment: occ  . Drug use: No    Comment: former     Allergies: No Known Allergies  Medications Prior to Admission  Medication Sig Dispense Refill Last Dose  . cetirizine (ZYRTEC) 10 MG tablet Take 10 mg by mouth daily.     Marland Kitchen  fluticasone (FLONASE) 50 MCG/ACT nasal spray Place 1 spray into both nostrils daily. 9.9 g 2   . HYDROcodone-acetaminophen (NORCO/VICODIN) 5-325 MG tablet Take 1 tablet by mouth every 6 (six) hours as needed for severe pain. 15 tablet 0   . ibuprofen (ADVIL) 800 MG tablet Take 1 tablet (800 mg total) by mouth every 8 (eight) hours as needed. 30 tablet 0   . Multiple Vitamin (MULTIVITAMIN WITH MINERALS) TABS tablet Take 1 tablet by mouth daily.     Marland Kitchen omeprazole (PRILOSEC) 20 MG capsule Take 1 capsule (20 mg total) by mouth daily. (Patient not taking: Reported on 03/17/2018) 30 capsule 1     Review of Systems  Constitutional: Negative.  Negative for fever.  Gastrointestinal: Positive for abdominal pain and constipation. Negative for diarrhea, nausea and  vomiting.  Genitourinary: Positive for vaginal bleeding. Negative for dysuria.  Musculoskeletal: Negative.   Neurological: Negative.    Physical Exam   Blood pressure 130/89, pulse 86, temperature 98.7 F (37.1 C), temperature source Axillary, resp. rate 17, weight 93.5 kg, unknown if currently breastfeeding.  Physical Exam  Constitutional: Lisa Fry appears well-developed.  Eyes: Pupils are equal, round, and reactive to light.  Respiratory: Effort normal.  GI: Soft. Bowel sounds are normal. Lisa Fry exhibits no distension and no mass. There is abdominal tenderness. There is no rebound and no guarding.  Bowel sounds present in all 4 quadrants; dressing is clean, dry and intact  Musculoskeletal:        General: Normal range of motion.     Cervical back: Normal range of motion.  Neurological: Lisa Fry is alert.    MAU Course  Procedures  MDM -X ray shows non-obstructing gas pattern and stool.  -I have independently reviewed the X ray images, which reveal finding of stool burden in bowels.  -patient had simethicone, levsyn and sorbitol while in MAU -offered enema, patient declined.  Patient will take 30 mg of toradol, declines opiates in order to avoid further constipation.  Declined bimanual pelvic exam due to patient's pain.   Assessment and Plan   1. Post-operative pain   -Explained to patient that Lisa Fry needs to ambulate and that will help move her bowels; Lisa Fry should return to MAU if pain does not improve in 2-3 days. Explained that the pain is coming in waves because her body is trying to move her bowels. Vaginal bleeding is normal post-ectopic as Lisa Fry still has remnants of her uterine lining to shed; Lisa Fry can expect to bleed off and on for the next week or so.  -Patient is planning to call her provider tomorrow and make a follow up appt.  -Warning signs to return would be fever, foul-smelling discharge, abdominal bloating, uncontrolled nausea and vomiting or no BM or gas in 3 days -Discouraged  frequent use of vicodin   -Patient is safe for discharge home with RX for Miralax and simethicone. Mervyn Skeeters Ikeisha Blumberg 05/02/2020, 11:02 PM

## 2020-05-02 NOTE — MAU Note (Addendum)
Pt reports to MAU stating that at 0300 on Friday she had a ectopic pregnancy removal. Pt states since last night she has been having abdominal pain that comes and goes every 5-10 min like a ctx. Pt reports some vaginal bleeding that started when the bleeding started. Pt reports the pain is a 8/10.   Pt states she sees blood in the toilet but it is unsure if it is from her urine or her vagina. Pt denies wearing pads.

## 2020-05-03 ENCOUNTER — Telehealth: Payer: Self-pay | Admitting: Women's Health

## 2020-05-03 DIAGNOSIS — A749 Chlamydial infection, unspecified: Secondary | ICD-10-CM

## 2020-05-03 LAB — SURGICAL PATHOLOGY

## 2020-05-03 MED ORDER — AZITHROMYCIN 500 MG PO TABS: 1000.0000 mg | ORAL_TABLET | Freq: Once | ORAL | 0 refills | Status: AC

## 2020-05-03 MED ORDER — POLYETHYLENE GLYCOL 3350 17 G PO PACK: 17.0000 g | PACK | Freq: Every day | ORAL | 0 refills | Status: DC

## 2020-05-03 MED ORDER — SORBITOL 70 % SOLN
15.0000 mL | Freq: Once | Status: AC
  Administered 2020-05-03: 15 mL via ORAL
  Filled 2020-05-03: qty 30

## 2020-05-03 MED ORDER — KETOROLAC TROMETHAMINE 30 MG/ML IJ SOLN
30.0000 mg | Freq: Once | INTRAMUSCULAR | Status: AC
  Administered 2020-05-03: 30 mg via INTRAMUSCULAR
  Filled 2020-05-03: qty 1

## 2020-05-03 MED ORDER — SIMETHICONE 80 MG PO CHEW
80.0000 mg | CHEWABLE_TABLET | Freq: Four times a day (QID) | ORAL | 0 refills | Status: DC | PRN
Start: 2020-05-03 — End: 2020-05-03

## 2020-05-03 MED ORDER — HYOSCYAMINE SULFATE 0.125 MG SL SUBL
0.2500 mg | SUBLINGUAL_TABLET | Freq: Once | SUBLINGUAL | Status: AC
  Administered 2020-05-03: 0.25 mg via ORAL
  Filled 2020-05-03 (×2): qty 2

## 2020-05-03 MED ORDER — SIMETHICONE 80 MG PO CHEW
80.0000 mg | CHEWABLE_TABLET | Freq: Four times a day (QID) | ORAL | 0 refills | Status: DC | PRN
Start: 2020-05-03 — End: 2020-08-02

## 2020-05-03 MED ORDER — DOCUSATE SODIUM 100 MG PO CAPS
100.0000 mg | ORAL_CAPSULE | Freq: Two times a day (BID) | ORAL | 0 refills | Status: DC
Start: 2020-05-03 — End: 2020-08-02

## 2020-05-03 MED ORDER — SIMETHICONE 80 MG PO CHEW
80.0000 mg | CHEWABLE_TABLET | Freq: Once | ORAL | Status: AC
  Administered 2020-05-03: 80 mg via ORAL
  Filled 2020-05-03: qty 1

## 2020-05-03 NOTE — Telephone Encounter (Signed)
Patient called and identified by two identifiers. Patient informed of +CT. Patient advised that all partners in the last 60 days should be tested and treated and she should abstain from intercourse from now until 7days after her current partner is treated. Patient reports NKDA and is currently taking ibuprofen, Norco, colace, simethicone, miralax. Patient verbalizes understanding and questions answered to patient satisfaction.  Marylen Ponto, NP  11:51 AM 05/03/2020

## 2020-05-03 NOTE — Discharge Instructions (Signed)
-  Warning signs to return are fever, foul-smelling discharge, abdominal bloating, uncontrolled nausea and vomiting and no BM or gas by Wednesday -walk as much as possible -take ibuprofen and gas pill and Miralax; avoid vicodin as it will make problem worse

## 2020-05-11 ENCOUNTER — Telehealth: Payer: Self-pay

## 2020-05-11 NOTE — Telephone Encounter (Signed)
-----   Message from York Ram sent at 05/05/2020  2:10 PM EDT ----- Regarding: FW: Quant Can you guys reach out to this patient please and have her scheduled accordingly. Per her discharge papers from Dr. Jeanann Lewandowsky she would like for the patient to follow-up here. ----- Message ----- From: Duane Lope, NP Sent: 04/25/2020   1:58 PM EDT To: [facility] Admin Pool Subject: Quant                                          Needs a Quant in the office on Tuesday, Stat for pregnancy of unknown.  Thank you,  Victorino Dike

## 2020-05-11 NOTE — Telephone Encounter (Signed)
Attempted to reach patient at both number listed. Home= no answer Mobile- voicemail box not set up. Armandina Stammer RN

## 2020-05-11 NOTE — Telephone Encounter (Signed)
Lisa Fry returned call to office - she had an ectopic that was operated on Apr 30, 2020 (our office did not receive the message for follow up hcg until 05-05-20)  Clarified with patient about she does still need follow up from her salpingectomy and removal of ectopic pregnancy. Patient scheduled to return to our office for follow up. Armandina Stammer RN

## 2020-05-21 ENCOUNTER — Encounter: Payer: Self-pay | Admitting: Obstetrics & Gynecology

## 2020-08-02 ENCOUNTER — Emergency Department (HOSPITAL_BASED_OUTPATIENT_CLINIC_OR_DEPARTMENT_OTHER)
Admission: EM | Admit: 2020-08-02 | Discharge: 2020-08-02 | Disposition: A | Discharge status: Home / Self Care | Payer: 59 | Attending: Emergency Medicine | Admitting: Emergency Medicine

## 2020-08-02 ENCOUNTER — Encounter (HOSPITAL_BASED_OUTPATIENT_CLINIC_OR_DEPARTMENT_OTHER): Payer: Self-pay | Admitting: *Deleted

## 2020-08-02 ENCOUNTER — Emergency Department (HOSPITAL_BASED_OUTPATIENT_CLINIC_OR_DEPARTMENT_OTHER): Payer: 59

## 2020-08-02 ENCOUNTER — Other Ambulatory Visit: Payer: Self-pay

## 2020-08-02 DIAGNOSIS — S20212A Contusion of left front wall of thorax, initial encounter: Secondary | ICD-10-CM | POA: Insufficient documentation

## 2020-08-02 DIAGNOSIS — Y999 Unspecified external cause status: Secondary | ICD-10-CM | POA: Insufficient documentation

## 2020-08-02 DIAGNOSIS — Z87891 Personal history of nicotine dependence: Secondary | ICD-10-CM | POA: Diagnosis not present

## 2020-08-02 DIAGNOSIS — W108XXA Fall (on) (from) other stairs and steps, initial encounter: Secondary | ICD-10-CM | POA: Insufficient documentation

## 2020-08-02 DIAGNOSIS — Y939 Activity, unspecified: Secondary | ICD-10-CM | POA: Insufficient documentation

## 2020-08-02 DIAGNOSIS — Y929 Unspecified place or not applicable: Secondary | ICD-10-CM | POA: Insufficient documentation

## 2020-08-02 DIAGNOSIS — S299XXA Unspecified injury of thorax, initial encounter: Secondary | ICD-10-CM | POA: Diagnosis present

## 2020-08-02 MED ORDER — METHOCARBAMOL 500 MG PO TABS
500.0000 mg | ORAL_TABLET | Freq: Once | ORAL | Status: AC
  Administered 2020-08-02: 500 mg via ORAL
  Filled 2020-08-02: qty 1

## 2020-08-02 MED ORDER — HYDROCODONE-ACETAMINOPHEN 5-325 MG PO TABS: 1.0000 | ORAL_TABLET | Freq: Four times a day (QID) | ORAL | 0 refills | Status: DC | PRN

## 2020-08-02 MED ORDER — OXYCODONE-ACETAMINOPHEN 5-325 MG PO TABS
1.0000 | ORAL_TABLET | Freq: Once | ORAL | Status: AC
  Administered 2020-08-02: 1 via ORAL
  Filled 2020-08-02: qty 1

## 2020-08-02 MED ORDER — KETOROLAC TROMETHAMINE 30 MG/ML IJ SOLN
30.0000 mg | Freq: Once | INTRAMUSCULAR | Status: AC
  Administered 2020-08-02: 30 mg via INTRAMUSCULAR
  Filled 2020-08-02: qty 1

## 2020-08-02 MED ORDER — CYCLOBENZAPRINE HCL 10 MG PO TABS
10.0000 mg | ORAL_TABLET | Freq: Two times a day (BID) | ORAL | 0 refills | Status: DC | PRN
Start: 2020-08-02 — End: 2021-03-12

## 2020-08-02 MED ORDER — IBUPROFEN 600 MG PO TABS: 600.0000 mg | ORAL_TABLET | Freq: Four times a day (QID) | ORAL | 0 refills | Status: DC | PRN

## 2020-08-02 NOTE — Discharge Instructions (Signed)
Please read instructions below. Apply ice to your ribs for 20 minutes at a time. You can take ibuprofen every 6 hours as needed for mild-moderate pain. You can take hydrocodone every 6 hours for more severe pain, please be aware this medication can make you drowsy- do not drive or drink alcohol while taking.  You can take flexeril every 12 hours for muscle spasm. This can also make you drowsy. Please use the incentive spirometer (breathing apparatus) every hour while awake to ensure your lungs stay healthy while you heal. You can also apply a Salon Pas patch WITH lidocaine to your area of pain for added pain relief. You can get this over-the-counter. Schedule an appointment with your primary care provider to follow-up on your injury. Return to the ER for sudden worsening chest pain or difficulty breathing, new or concerning symptoms.

## 2020-08-02 NOTE — ED Provider Notes (Signed)
[facility] EMERGENCY DEPARTMENT Provider Note   CSN: [id number] Arrival date & time: 08/02/20  1138     History Chief Complaint  Patient presents with  . Fall    Lisa Fry is a 29 y.o. female presenting to the ED with complaint of left rib pain after mechanical fall down the stairs last night. She states she was up for a glass of water and fell down a flight of stairs. She endorses left sided posterior rib pain, worse with movement and palpation. Denies associated SOB, cough, hemoptysis. Denies head trauma or LOC. Patient is not taking anticoagulants. No medications taken for symptoms. No other injuries reported.  The history is provided by the patient.       Past Medical History:  Diagnosis Date  . Cyst of ovary     Patient Active Problem List   Diagnosis Date Noted  . 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 LAPAROSCOPIC UNILATERAL SALPINGECTOMY Right 04/29/2020   Procedure: LAPAROSCOPIC UNILATERAL SALPINGECTOMY WITH REMOVAL OF ECTOPIC PREGNANCY;  Surgeon: Catalina Pizza, MD;  Location: [facility] OR;  Service: Gynecology;  Laterality: Right;  . LEG SURGERY Left   . LIPOSUCTION AUOLOGOUS FAT TRANSFER TO BUTTOCKS    . UMBILICAL HERNIA REPAIR       OB History    Gravida  5   Para  3   Term  3   Preterm  0   AB  1   Living  3     SAB  0   TAB  1   Ectopic  0   Multiple  0   Live Births  3           Family History  Problem Relation Age of Onset  . Healthy Mother   . HIV Father     Social History   Tobacco Use  . Smoking status: Former Smoker    Types: Cigarettes  . Smokeless tobacco: Never Used  . Tobacco comment: stopped age 89 "a phase"  Vaping Use  . Vaping Use: Never used  Substance Use Topics  . Alcohol use: Not Currently    Comment: occ  . Drug use: No    Comment: former     Home Medications Prior to Admission medications   Medication Sig Start  Date End Date Taking? Authorizing Provider  Multiple Vitamin (MULTIVITAMIN WITH MINERALS) TABS tablet Take 1 tablet by mouth daily.   Yes [provider]  cyclobenzaprine (FLEXERIL) 10 MG tablet Take 1 tablet (10 mg total) by mouth 2 (two) times daily as needed for muscle spasms. 08/02/20   Clois Treanor, [place] N, PA-C  HYDROcodone-acetaminophen (NORCO/VICODIN) 5-325 MG tablet Take 1-2 tablets by mouth every 6 (six) hours as needed for severe pain. 08/02/20   Gentri Guardado, [place] N, PA-C  ibuprofen (ADVIL) 600 MG tablet Take 1 tablet (600 mg total) by mouth every 6 (six) hours as needed for mild pain or moderate pain. 08/02/20   Shondale Quinley, [place] N, PA-C    Allergies    Patient has no known allergies.  Review of Systems   Review of Systems  Respiratory: Negative for cough and shortness of breath.   Cardiovascular: Positive for chest pain.  Gastrointestinal: Negative for abdominal pain.  Hematological: Does not bruise/bleed easily.  All other systems reviewed and are negative.   Physical Exam Updated Vital Signs BP (!) 145/94 (BP Location: Right Arm)   Pulse 71   Temp 98.6 F (  37 C) (Oral)   Resp 16   Ht 5\' 4"  (1.626 m)   Wt 90.7 kg   LMP 07/27/2020   SpO2 100%   BMI 34.33 kg/m   Physical Exam Vitals and nursing note reviewed.  Constitutional:      Appearance: She is well-developed.  HENT:     Head: Normocephalic and atraumatic.  Eyes:     Conjunctiva/sclera: Conjunctivae normal.  Cardiovascular:     Rate and Rhythm: Normal rate and regular rhythm.  Pulmonary:     Effort: Pulmonary effort is normal. No respiratory distress.     Breath sounds: Normal breath sounds.     Comments: Symmetric chest expansion, no deformity. Nl work of breathing. Left posterior/lateral chest wall with tenderness. No wounds or ecchymosis. Abdominal:     General: Bowel sounds are normal.     Palpations: Abdomen is soft.     Tenderness: There is no abdominal tenderness.  Neurological:     Mental  Status: She is alert.  Psychiatric:        Mood and Affect: Mood normal.        Behavior: Behavior normal.     ED Results / Procedures / Treatments   Labs (all labs ordered are listed, but only abnormal results are displayed) Labs Reviewed - No data to display  EKG None  Radiology DG Ribs Unilateral W/Chest Left  Result Date: 08/02/2020 CLINICAL DATA:  Pain following fall EXAM: LEFT RIBS AND CHEST - 3+ VIEW COMPARISON:  None. FINDINGS: Frontal chest as well as oblique and cone-down rib images obtained. Lungs are clear. Heart size and pulmonary vascularity are normal. No adenopathy. No evident pneumothorax or pleural effusion. No evident rib fracture. IMPRESSION: No evident rib fracture.  Lungs clear. Electronically Signed   By: 10/02/2020 III M.D.   On: 08/02/2020 12:26    Procedures Procedures (including critical care time)  Medications Ordered in ED Medications  oxyCODONE-acetaminophen (PERCOCET/ROXICET) 5-325 MG per tablet 1 tablet (1 tablet Oral Given 08/02/20 1446)  methocarbamol (ROBAXIN) tablet 500 mg (500 mg Oral Given 08/02/20 1446)  ketorolac (TORADOL) 30 MG/ML injection 30 mg (30 mg Intramuscular Given 08/02/20 1446)    ED Course  I have reviewed the triage vital signs and the nursing notes.  Pertinent labs & imaging results that were available during my care of the patient were reviewed by me and considered in my medical decision making (see chart for details).    MDM Rules/Calculators/A&P                          Patient with left rib injury after accidentally falling on the stairs last night in the overnight.  No other injuries reported, no head trauma LOC.  Not on anticoagulation.  She has left posterior lateral rib pain and tenderness, no deformity, symmetric chest expansion.  No respiratory complaints, normal work of breathing and O2 saturation.  X-ray is negative.  Discussed possibility of occult rib fracture and supportive management.  She will be prescribed  pain medicine, muscle relaxant, NSAIDs.  Instructed to apply ice and use incentive spirometer.  She is instructed to follow-up with PCP, return precautions discussed.  Patient is agreeable plan, appropriate for discharge.  [place] 10[place]02[place]20 Controlled Substance reporting System queried Final Clinical Impression(s) / ED Diagnoses Final diagnoses:  Contusion of rib on left side, initial encounter    Rx / DC Orders ED Discharge Orders         Ordered  HYDROcodone-acetaminophen (NORCO/VICODIN) 5-325 MG tablet  Every 6 hours PRN     Discontinue  Reprint     08/02/20 1442    cyclobenzaprine (FLEXERIL) 10 MG tablet  2 times daily PRN     Discontinue  Reprint     08/02/20 1442    ibuprofen (ADVIL) 600 MG tablet  Every 6 hours PRN     Discontinue  Reprint     08/02/20 1442           Vedha Tercero, [place] N, PA-C 08/02/20 1525    Terrilee Files, MD 08/02/20 2137

## 2020-08-02 NOTE — ED Triage Notes (Signed)
She slip down 10 steps on her way to the rest room this am. Left rib pain.

## 2021-03-12 ENCOUNTER — Inpatient Hospital Stay (HOSPITAL_COMMUNITY)
Admission: AD | Admit: 2021-03-12 | Discharge: 2021-03-12 | Disposition: A | Discharge status: Home / Self Care | Payer: Medicaid Other | Attending: Obstetrics & Gynecology | Admitting: Obstetrics & Gynecology

## 2021-03-12 ENCOUNTER — Encounter (HOSPITAL_COMMUNITY): Payer: Self-pay | Admitting: Obstetrics & Gynecology

## 2021-03-12 ENCOUNTER — Other Ambulatory Visit: Payer: Self-pay

## 2021-03-12 DIAGNOSIS — K5900 Constipation, unspecified: Secondary | ICD-10-CM | POA: Diagnosis not present

## 2021-03-12 DIAGNOSIS — D509 Iron deficiency anemia, unspecified: Secondary | ICD-10-CM | POA: Diagnosis not present

## 2021-03-12 DIAGNOSIS — Z3A12 12 weeks gestation of pregnancy: Secondary | ICD-10-CM

## 2021-03-12 DIAGNOSIS — Z87891 Personal history of nicotine dependence: Secondary | ICD-10-CM | POA: Diagnosis not present

## 2021-03-12 DIAGNOSIS — O99011 Anemia complicating pregnancy, first trimester: Secondary | ICD-10-CM | POA: Insufficient documentation

## 2021-03-12 DIAGNOSIS — O99611 Diseases of the digestive system complicating pregnancy, first trimester: Secondary | ICD-10-CM | POA: Diagnosis not present

## 2021-03-12 LAB — URINALYSIS, ROUTINE W REFLEX MICROSCOPIC
Bilirubin Urine: NEGATIVE
Glucose, UA: NEGATIVE mg/dL
Hgb urine dipstick: NEGATIVE
Ketones, ur: NEGATIVE mg/dL
Leukocytes,Ua: NEGATIVE
Nitrite: NEGATIVE
Protein, ur: NEGATIVE mg/dL
Specific Gravity, Urine: 1.028 (ref 1.005–1.030)
pH: 6 (ref 5.0–8.0)

## 2021-03-12 NOTE — Discharge Instructions (Signed)
You have constipation which is hard stools that are difficult to pass. It is important to have regular bowel movements every 1-3 days that are soft and easy to pass. Hard stools increase your risk of hemorrhoids and are very uncomfortable.   To prevent constipation you can increase the amount of fiber in your diet. Examples of foods with fiber are leafy greens, whole grain breads, oatmeal and other grains.  It is also important to drink at least eight 8oz glass of water everyday.   If you have not has a bowel movement in 4-5 days you made need to clean out your bowel.  This will have establish normal movement through your bowel.    Miralax Clean out  Take 8 capfuls of miralax in 64 oz of gatorade. You can use any fluid that appeals to you (gatorade, water, juice)  Continue to drink at least eight 8 oz glasses of water throughout the day  You can repeat with another 8 capfuls of miralax in 64 oz of gatorade if you are not having a large amount of stools  You will need to be at home and close to a bathroom for about 8 hours when you do the above as you may need to go to the bathroom frequently.   After you are cleaned out: - Start Colace100mg  twice daily - Start Miralax once daily - Start a daily fiber supplement like metamucil or citrucel - You can safely use enemas in pregnancy  - if you are having diarrhea you can reduce to Colace once a day or miralax every other day or a 1/2 capful daily.                         Safe Medications in Pregnancy    Acne: Benzoyl Peroxide Salicylic Acid  Backache/Headache: Tylenol: 2 regular strength every 4 hours OR              2 Extra strength every 6 hours  Colds/Coughs/Allergies: Benadryl (alcohol free) 25 mg every 6 hours as needed Breath right strips Claritin Cepacol throat lozenges Chloraseptic throat spray Cold-Eeze- up to three times per day Cough drops, alcohol free Flonase (by prescription  only) Guaifenesin Mucinex Robitussin DM (plain only, alcohol free) Saline nasal spray/drops Sudafed (pseudoephedrine) & Actifed ** use only after [redacted] weeks gestation and if you do not have high blood pressure Tylenol Vicks Vaporub Zinc lozenges Zyrtec   Constipation: Colace Ducolax suppositories Fleet enema Glycerin suppositories Metamucil Milk of magnesia** Miralax Senokot Smooth move tea  Diarrhea: Kaopectate Imodium A-D  *NO pepto Bismol  Hemorrhoids: Anusol Anusol HC Preparation H Tucks  Indigestion: Tums Maalox Mylanta Zantac  Pepcid  Insomnia: Benadryl (alcohol free) 25mg  every 6 hours as needed Tylenol PM Unisom, no Gelcaps  Leg Cramps: Tums MagGel  Nausea/Vomiting:  Bonine Dramamine Emetrol Ginger extract Sea bands Meclizine  Nausea medication to take during pregnancy:  Unisom (doxylamine succinate 25 mg tablets) Take one tablet daily at bedtime. If symptoms are not adequately controlled, the dose can be increased to a maximum recommended dose of two tablets daily (1/2 tablet in the morning, 1/2 tablet mid-afternoon and one at bedtime). Vitamin B6 100mg  tablets. Take one tablet twice a day (up to 200 mg per day).  Skin Rashes: Aveeno products Benadryl cream or 25mg  every 6 hours as needed Calamine Lotion 1% cortisone cream  Yeast infection: Gyne-lotrimin 7 Monistat 7   **If taking multiple medications, please check labels to avoid duplicating  the same active ingredients **take medication as directed on the label ** Do not exceed 4000 mg of tylenol in 24 hours **Do not take medications that contain aspirin or ibuprofen           Constipation, Adult Constipation is when a person has fewer than three bowel movements in a week, has difficulty having a bowel movement, or has stools (feces) that are dry, hard, or larger than normal. Constipation may be caused by an underlying condition. It may become worse with age if a person  takes certain medicines and does not take in enough fluids. Follow these instructions at home: Eating and drinking  Eat foods that have a lot of fiber, such as beans, whole grains, and fresh fruits and vegetables.  Limit foods that are low in fiber and high in fat and processed sugars, such as fried or sweet foods. These include french fries, hamburgers, cookies, candies, and soda.  Drink enough fluid to keep your urine pale yellow.   General instructions  Exercise regularly or as told by your health care provider. Try to do 150 minutes of moderate exercise each week.  Use the bathroom when you have the urge to go. Do not hold it in.  Take over-the-counter and prescription medicines only as told by your health care provider. This includes any fiber supplements.  During bowel movements: ? Practice deep breathing while relaxing the lower abdomen. ? Practice pelvic floor relaxation.  Watch your condition for any changes. Let your health care provider know about them.  Keep all follow-up visits as told by your health care provider. This is important. Contact a health care provider if:  You have pain that gets worse.  You have a fever.  You do not have a bowel movement after 4 days.  You vomit.  You are not hungry or you lose weight.  You are bleeding from the opening between the buttocks (anus).  You have thin, pencil-like stools. Get help right away if:  You have a fever and your symptoms suddenly get worse.  You leak stool or have blood in your stool.  Your abdomen is bloated.  You have severe pain in your abdomen.  You feel dizzy or you faint. Summary  Constipation is when a person has fewer than three bowel movements in a week, has difficulty having a bowel movement, or has stools (feces) that are dry, hard, or larger than normal.  Eat foods that have a lot of fiber, such as beans, whole grains, and fresh fruits and vegetables.  Drink enough fluid to keep your  urine pale yellow.  Take over-the-counter and prescription medicines only as told by your health care provider. This includes any fiber supplements. This information is not intended to replace advice given to you by your health care provider. Make sure you discuss any questions you have with your health care provider. Document Revised: 10/29/2019 Document Reviewed: 10/29/2019 Elsevier Patient Education  2021 [employer].

## 2021-03-12 NOTE — MAU Provider Note (Addendum)
History     CSN: [id number]  Arrival date and time: 03/12/21 1611   Event Date/Time   First Provider Initiated Contact with Patient 03/12/21 1700      Chief Complaint  Patient presents with   Constipation   [name] is a 30 y.o. [id number] at [redacted]w[redacted]d who presents to MAU for constipation. Patient reports she believes it has been about 1.5 weeks since her last bowel movement of any kind, but she is unsure of the exact date. Patient states recently she has not been able to pass any stool at all. She tries to, but reports it is very hard and will not come out. Patient reports she is starting to feel bloated because of the constipation. Patient reports she was put on iron by her OB on Monday and was also given Colace. Patient reports she was told to take Colace once daily and her iron supplement every other day, which she has been doing since Monday 03/07/2021.  Pt denies VB, LOF, ctx, decreased FM, vaginal discharge/odor/itching. Pt denies N/V, abdominal pain, diarrhea, or urinary problems. Pt denies fever, chills, fatigue, sweating or changes in appetite. Pt denies SOB or chest pain. Pt denies dizziness, HA, light-headedness, weakness.  Problems this pregnancy include: anemia. Allergies? NKDA Current medications/supplements? Iron, PNV   OB History     Gravida  6   Para  3   Term  3   Preterm  0   AB  1   Living  3      SAB  0   IAB  1   Ectopic  0   Multiple  0   Live Births  3           Past Medical History:  Diagnosis Date   Cyst of ovary     Past Surgical History:  Procedure Laterality Date   ECTOPIC PREGNANCY SURGERY     LAPAROSCOPIC UNILATERAL SALPINGECTOMY Right 04/29/2020   Procedure: LAPAROSCOPIC UNILATERAL SALPINGECTOMY WITH REMOVAL OF ECTOPIC PREGNANCY;  Surgeon: Catalina Pizza, MD;  Location: [facility] OR;  Service: Gynecology;  Laterality: Right;   LEG SURGERY Left    LIPOSUCTION AUOLOGOUS FAT TRANSFER TO BUTTOCKS     UMBILICAL HERNIA  REPAIR      Family History  Problem Relation Age of Onset   Healthy Mother    HIV Father     Social History   Tobacco Use   Smoking status: Former Smoker    Types: Cigarettes   Smokeless tobacco: Never Used   Tobacco comment: stopped age 48 "a phase"  Vaping Use   Vaping Use: Never used  Substance Use Topics   Alcohol use: Not Currently    Comment: occ   Drug use: No    Comment: former     Allergies: No Known Allergies  Medications Prior to Admission  Medication Sig Dispense Refill Last Dose   ferrous sulfate 325 (65 FE) MG tablet Take 325 mg by mouth daily with breakfast.   03/12/2021 at Unknown time   Multiple Vitamin (MULTIVITAMIN WITH MINERALS) TABS tablet Take 1 tablet by mouth daily.   03/12/2021 at Unknown time   Prenatal Vit-Fe Fumarate-FA (MULTIVITAMIN-PRENATAL) 27-0.8 MG TABS tablet Take 1 tablet by mouth daily at 12 noon.   03/12/2021 at Unknown time    Review of Systems  Constitutional: Negative for chills, diaphoresis, fatigue and fever.  Eyes: Negative for visual disturbance.  Respiratory: Negative for shortness of breath.   Cardiovascular: Negative for chest pain.  Gastrointestinal: Positive for  constipation. Negative for abdominal pain, diarrhea, nausea and vomiting.  Genitourinary: Negative for dysuria, flank pain, frequency, pelvic pain, urgency, vaginal bleeding and vaginal discharge.  Neurological: Negative for dizziness, weakness, light-headedness and headaches.   Physical Exam   Blood pressure (!) 108/55, pulse 62, temperature 98.4 F (36.9 C), temperature source Oral, resp. rate 17, height 5\' 4"  (1.626 m), weight 93.4 kg, last menstrual period 07/27/2020, SpO2 100 %, unknown if currently breastfeeding.  Patient Vitals for the past 24 hrs:  BP Temp Temp src Pulse Resp SpO2 Height Weight  03/12/21 1834 (!) 108/55 98.4 F (36.9 C) Oral 62 17 100 % -- --  03/12/21 1647 (!) 102/58 98.3 F (36.8 C) Oral 73 17 -- -- --  03/12/21 1642 -- -- -- -- --  -- 5\' 4"  (1.626 m) 93.4 kg  03/12/21 1633 123/62 98.4 F (36.9 C) Oral 70 17 99 % -- --   Physical Exam Vitals and nursing note reviewed.  Constitutional:      General: She is not in acute distress.    Appearance: Normal appearance. She is well-developed. She is not diaphoretic.  HENT:     Head: Normocephalic and atraumatic.  Pulmonary:     Effort: Pulmonary effort is normal.  Abdominal:     General: There is no distension.     Palpations: Abdomen is soft. There is no mass.     Tenderness: There is no abdominal tenderness. There is no guarding or rebound.  Skin:    General: Skin is warm and dry.  Neurological:     Mental Status: She is alert and oriented to person, place, and time.  Psychiatric:        Behavior: Behavior normal.        Thought Content: Thought content normal.        Judgment: Judgment normal.    Results for orders placed or performed during the hospital encounter of 03/12/21 (from the past 24 hour(s))  Urinalysis, Routine w reflex microscopic Urine, Clean Catch     Status: Abnormal   Collection Time: 03/12/21  5:55 PM  Result Value Ref Range   Color, Urine YELLOW YELLOW   APPearance HAZY (A) CLEAR   Specific Gravity, Urine 1.028 1.005 - 1.030   pH 6.0 5.0 - 8.0   Glucose, UA NEGATIVE NEGATIVE mg/dL   Hgb urine dipstick NEGATIVE NEGATIVE   Bilirubin Urine NEGATIVE NEGATIVE   Ketones, ur NEGATIVE NEGATIVE mg/dL   Protein, ur NEGATIVE NEGATIVE mg/dL   Nitrite NEGATIVE NEGATIVE   Leukocytes,Ua NEGATIVE NEGATIVE    MAU Course  Procedures  MDM -constipation -soap suds enema attempted, pt able to tolerate about 03/14/21 per RN, but was not able to hold it for more than a couple of minutes -small stool came out with liquid, but pt reports was not a normal bowel movement -will try at-home options -pt discharged to home in stable condition  Orders Placed This Encounter  Procedures   Urinalysis, Routine w reflex microscopic Urine, Clean Catch    Standing  Status:   Standing    Number of Occurrences:   1   Soap suds enema    Standing Status:   Standing    Number of Occurrences:   1   Discharge patient    Order Specific Question:   Discharge disposition    Answer:   01-Home or Self Care [1]    Order Specific Question:   Discharge patient date    Answer:   03/12/2021   No  orders of the defined types were placed in this encounter.  Assessment and Plan   1. Constipation during pregnancy in first trimester   2. [redacted] weeks gestation of pregnancy    Allergies as of 03/12/2021   No Known Allergies      Medication List     TAKE these medications    ferrous sulfate 325 (65 FE) MG tablet Take 325 mg by mouth daily with breakfast.   multivitamin with minerals Tabs tablet Take 1 tablet by mouth daily.   multivitamin-prenatal 27-0.8 MG Tabs tablet Take 1 tablet by mouth daily at 12 noon.       -gave miralax protocol and discussed Milk of Magnesia for home use -pt advised to call PCP if no relief using one of above -discussed daily constipation prevention regimen after bowel movement -return MAU precautions given -pt discharged to home in stable condition  Odie Sera Nugent 03/12/2021, 6:39 PM   Attestation of Attending Supervision of Advanced Practice Provider (PA/CNM/NP): Evaluation and management procedures were performed by the Advanced Practice Provider under my supervision and collaboration.  I have reviewed the Advanced Practice Provider's note and chart, and I agree with the management and plan. I have also made any necessary editorial changes.   Sharon Seller, DO Attending Obstetrician & Gynecologist, [facility] for [facility], [facility] 03/12/2021 7:43 PM

## 2021-03-12 NOTE — MAU Note (Signed)
Pt presents to unit with complaint of constipation; hasn't had a bowel movement in a week and a half. Pain rated as 7 on 1- 1-10 scale. No leaking of fluid or vaginal bleeding reported.

## 2021-05-26 ENCOUNTER — Inpatient Hospital Stay (HOSPITAL_COMMUNITY)
Admission: EM | Admit: 2021-05-26 | Discharge: 2021-05-26 | Disposition: A | Discharge status: Home / Self Care | Payer: Medicaid Other | Attending: Obstetrics and Gynecology | Admitting: Obstetrics and Gynecology

## 2021-05-26 ENCOUNTER — Encounter (HOSPITAL_COMMUNITY): Payer: Self-pay

## 2021-05-26 ENCOUNTER — Other Ambulatory Visit: Payer: Self-pay

## 2021-05-26 DIAGNOSIS — Z3A23 23 weeks gestation of pregnancy: Secondary | ICD-10-CM | POA: Diagnosis not present

## 2021-05-26 DIAGNOSIS — R1030 Lower abdominal pain, unspecified: Secondary | ICD-10-CM | POA: Diagnosis not present

## 2021-05-26 DIAGNOSIS — Y92410 Unspecified street and highway as the place of occurrence of the external cause: Secondary | ICD-10-CM | POA: Diagnosis not present

## 2021-05-26 DIAGNOSIS — G44209 Tension-type headache, unspecified, not intractable: Secondary | ICD-10-CM

## 2021-05-26 DIAGNOSIS — O9A212 Injury, poisoning and certain other consequences of external causes complicating pregnancy, second trimester: Secondary | ICD-10-CM | POA: Insufficient documentation

## 2021-05-26 DIAGNOSIS — O99891 Other specified diseases and conditions complicating pregnancy: Secondary | ICD-10-CM | POA: Diagnosis not present

## 2021-05-26 DIAGNOSIS — O219 Vomiting of pregnancy, unspecified: Secondary | ICD-10-CM

## 2021-05-26 MED ORDER — ACETAMINOPHEN 500 MG PO TABS
1000.0000 mg | ORAL_TABLET | Freq: Once | ORAL | Status: AC
  Administered 2021-05-26: 1000 mg via ORAL
  Filled 2021-05-26: qty 2

## 2021-05-26 MED ORDER — ONDANSETRON 4 MG PO TBDP
4.0000 mg | ORAL_TABLET | Freq: Once | ORAL | Status: AC
  Administered 2021-05-26: 4 mg via ORAL
  Filled 2021-05-26: qty 1
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
## 2021-05-26 NOTE — ED Notes (Signed)
Per MD, trauma work up not required per pt condition. Cleared for transport to MAU.
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
## 2021-05-26 NOTE — Progress Notes (Signed)
MAU called and given report. Said that pt may come over now.
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
## 2021-05-27 ENCOUNTER — Encounter (HOSPITAL_COMMUNITY): Payer: Self-pay | Admitting: Obstetrics & Gynecology

## 2021-08-15 ENCOUNTER — Encounter (HOSPITAL_COMMUNITY): Payer: Self-pay | Admitting: Obstetrics and Gynecology

## 2021-08-15 ENCOUNTER — Inpatient Hospital Stay (HOSPITAL_COMMUNITY)
Admission: AD | Admit: 2021-08-15 | Discharge: 2021-08-16 | Disposition: A | Discharge status: Home / Self Care | Payer: Medicaid Other | Attending: Obstetrics and Gynecology | Admitting: Obstetrics and Gynecology

## 2021-08-15 ENCOUNTER — Other Ambulatory Visit: Payer: Self-pay

## 2021-08-15 ENCOUNTER — Inpatient Hospital Stay (HOSPITAL_BASED_OUTPATIENT_CLINIC_OR_DEPARTMENT_OTHER): Payer: Medicaid Other

## 2021-08-15 DIAGNOSIS — O368130 Decreased fetal movements, third trimester, not applicable or unspecified: Secondary | ICD-10-CM | POA: Diagnosis present

## 2021-08-15 DIAGNOSIS — Z79899 Other long term (current) drug therapy: Secondary | ICD-10-CM | POA: Diagnosis not present

## 2021-08-15 DIAGNOSIS — Z3A34 34 weeks gestation of pregnancy: Secondary | ICD-10-CM | POA: Insufficient documentation

## 2021-08-15 DIAGNOSIS — Z3689 Encounter for other specified antenatal screening: Secondary | ICD-10-CM | POA: Diagnosis not present

## 2021-08-15 HISTORY — DX: Gastro-esophageal reflux disease without esophagitis: K21.9

## 2021-08-15 NOTE — MAU Note (Signed)
..  Lisa Fry is a 30 y.o. at [redacted]w[redacted]d here in MAU reporting: Does not remember the last time she felt baby move. Denies pain.  Pain score: 0/10 \

## 2021-08-15 NOTE — MAU Provider Note (Signed)
History   [id number]   Chief Complaint  Patient presents with   Decreased Fetal Movement    HPI Lisa Fry is a 30 y.o. female  [id number] here with report of decreased fetal movement since this morning.  Reports she has felt some "flutters" every now and then but not the usual movements.  Denies abdominal pain, vaginal bleeding, or abdominal trauma.  Patient's last menstrual period was 07/27/2020.  OB History  Gravida Para Term Preterm AB Living  10 6 6  0 2 3  SAB IAB Ectopic Multiple Live Births  0 1 1   4     # Outcome Date GA Lbr Len/2nd Weight Sex Delivery Anes PTL Lv  10 Current           9 Ectopic 05/02/20          8 Term 11/19/12     Vag-Spont     7 Term 01/26/11     Vag-Spont     6 Term 02/13/06     Vag-Spont   DEC  5 Gravida           4 IAB           3 Term         LIV  2 Term         LIV  1 Term         LIV    Past Medical History:  Diagnosis Date   Cyst of ovary    GERD (gastroesophageal reflux disease)     Family History  Problem Relation Age of Onset   Healthy Mother    HIV Father     Social History   Socioeconomic History   Marital status: Single    Spouse name: Not on file   Number of children: Not on file   Years of education: Not on file   Highest education level: Not on file  Occupational History   Not on file  Tobacco Use   Smoking status: Never   Smokeless tobacco: Never   Tobacco comments:    stopped age 53 "a phase"  Vaping Use   Vaping Use: Never used  Substance and Sexual Activity   Alcohol use: Never    Comment: occ   Drug use: Never    Comment: former    Sexual activity: Never    Partners: Male    Birth control/protection: None  Other Topics Concern   Not on file  Social History Narrative   ** Merged History Encounter **       Social Determinants of Health   Financial Resource Strain: Not on file  Food Insecurity: Not on file  Transportation Needs: Not on file  Physical Activity: Not on file  Stress:  Not on file  Social Connections: Not on file    No Known Allergies  No current facility-administered medications on file prior to encounter.   Current Outpatient Medications on File Prior to Encounter  Medication Sig Dispense Refill   ferrous sulfate 325 (65 FE) MG tablet Take 325 mg by mouth daily with breakfast.     Prenatal Vit-Fe Fumarate-FA (MULTIVITAMIN-PRENATAL) 27-0.8 MG TABS tablet Take 1 tablet by mouth daily at 12 noon.     Multiple Vitamin (MULTIVITAMIN WITH MINERALS) TABS tablet Take 1 tablet by mouth daily.     Prenatal Vit-Fe Fumarate-FA (PRENATAL MULTIVITAMIN) TABS tablet Take 1 tablet by mouth daily at 12 noon.       Review of Systems  Constitutional: Negative.  Gastrointestinal: Negative.   Genitourinary: Negative.     Physical Exam   Vitals:   08/15/21 2200  BP: 135/71  Pulse: 84  Resp: 17  Temp: 98.9 F (37.2 C)  TempSrc: Oral  SpO2: 100%    Physical Exam Vitals and nursing note reviewed.  Constitutional:      General: She is not in acute distress.    Appearance: Normal appearance.  HENT:     Head: Normocephalic and atraumatic.  Eyes:     General: No scleral icterus. Pulmonary:     Effort: Pulmonary effort is normal. No respiratory distress.  Abdominal:     Palpations: Abdomen is soft.     Tenderness: There is no abdominal tenderness.  Skin:    General: Skin is warm and dry.  Neurological:     Mental Status: She is alert.  Psychiatric:        Mood and Affect: Mood normal.        Behavior: Behavior normal.   NST:  Baseline: 150 bpm, Variability: Good {> 6 bpm), Accelerations: Reactive, and Decelerations: Absent  MAU Course  Procedures  MDM Patient presents reporting change in perceived fetal movement.  She has reactive tracing and movement can be her third monitor.  She reports feeling 5 minutes following the monitor but states they are more like flutters from the movement she is used to.  She is still very concerned even after  monitoring and movement. BPP ordered & is 8/8 with normal AFI. Pt reassured.   Assessment and Plan   1. NST (non-stress test) reactive   2. [redacted] weeks gestation of pregnancy   3. Decreased fetal movement, third trimester, not applicable or unspecified fetus    -reviewed reasons to return to MAU -keep f/u with OB   Judeth Horn, NP 08/15/2021 10:35 PM
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
## 2022-04-25 IMAGING — US US OB TRANSVAGINAL
1 series · 15 of 28 positions shown · non-contrast
Comparison: 04/22/2020

CLINICAL DATA: Pelvic pain and vaginal bleeding. 1st trimester
pregnancy of unknown anatomic location.

EXAM:
TRANSVAGINAL OB ULTRASOUND
TECHNIQUE: Transvaginal ultrasound was performed for complete evaluation of the
gestation as well as the maternal uterus, adnexal regions, and
pelvic cul-de-sac.

[Series 1: us ob transvaginal · 34 acquisitions, 15 frames shown]
[im 1/34]
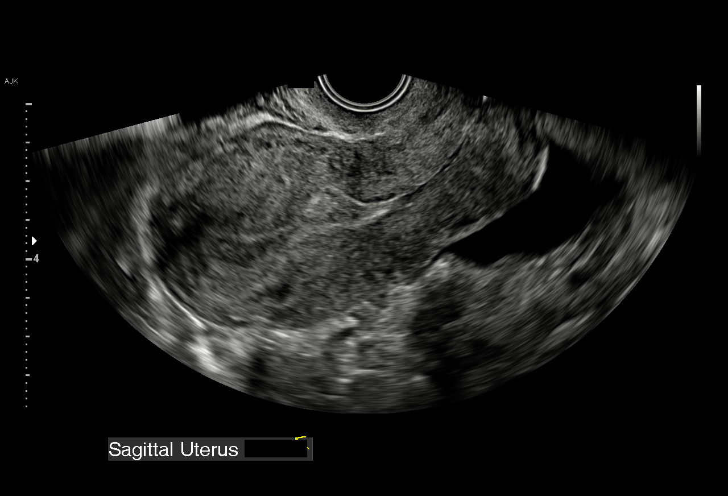
[im 3/34]
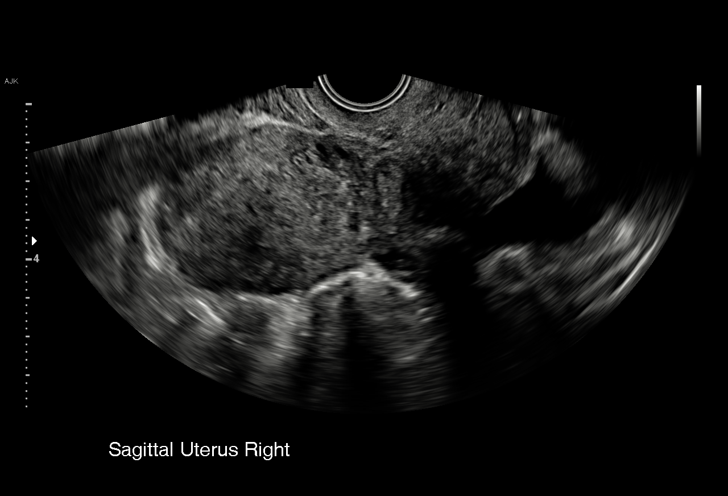
[im 5/34]
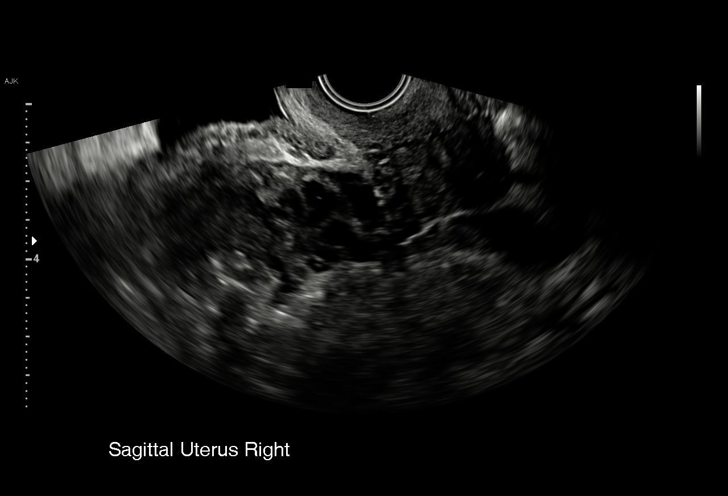
[im 8/34]
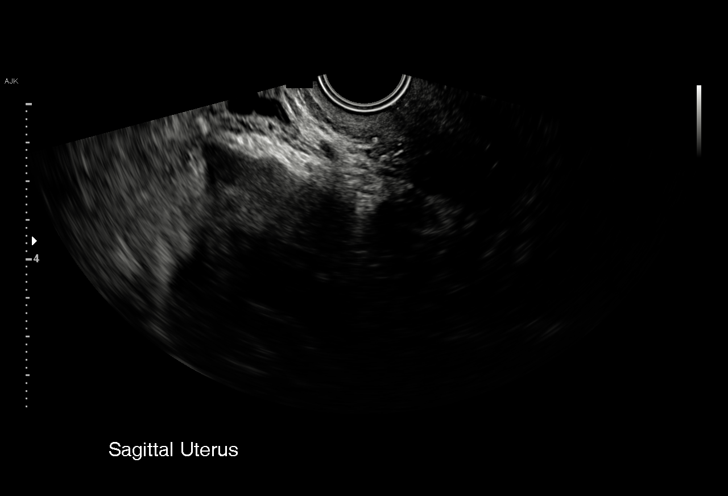
[im 10/34]
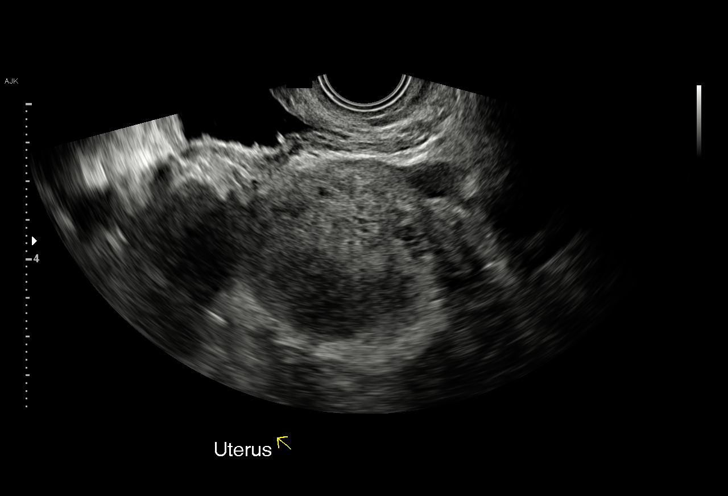
[im 13/34]
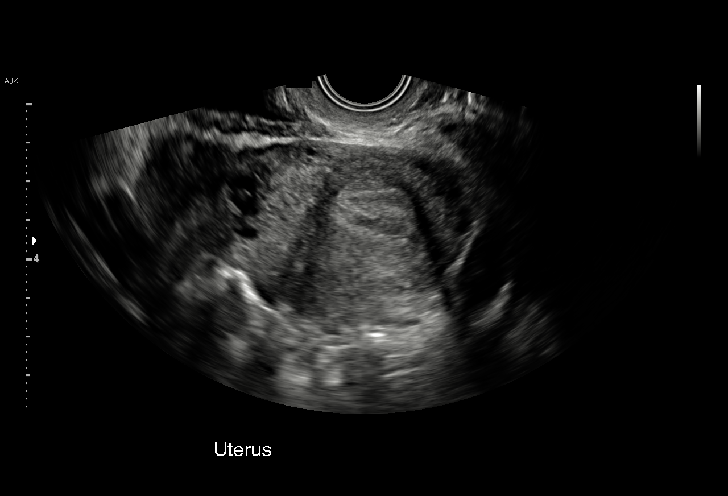
[im 15/34]
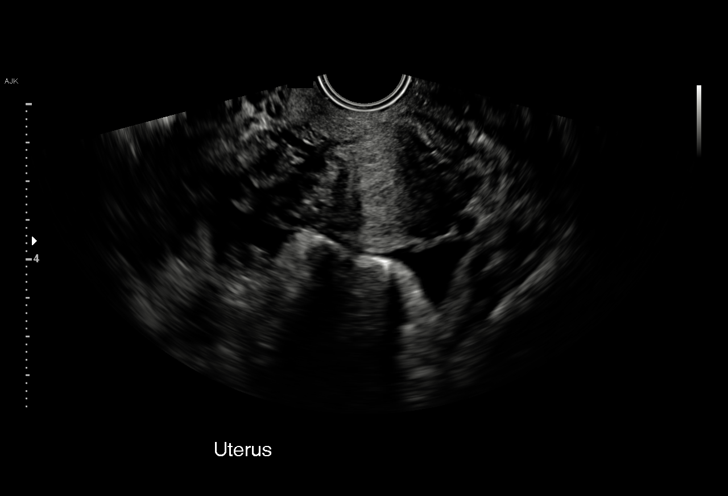
[im 18/34]
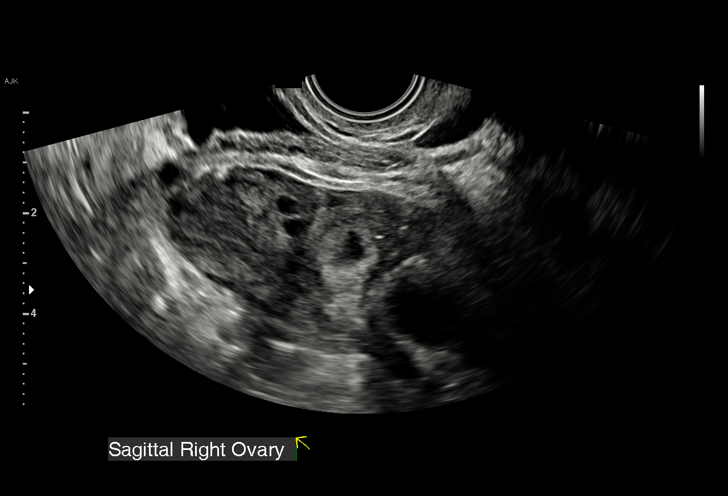
[im 19/34]
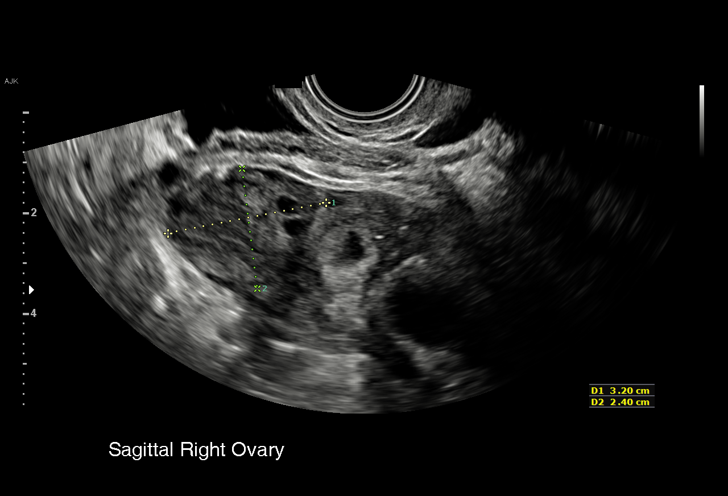
[im 21/34]
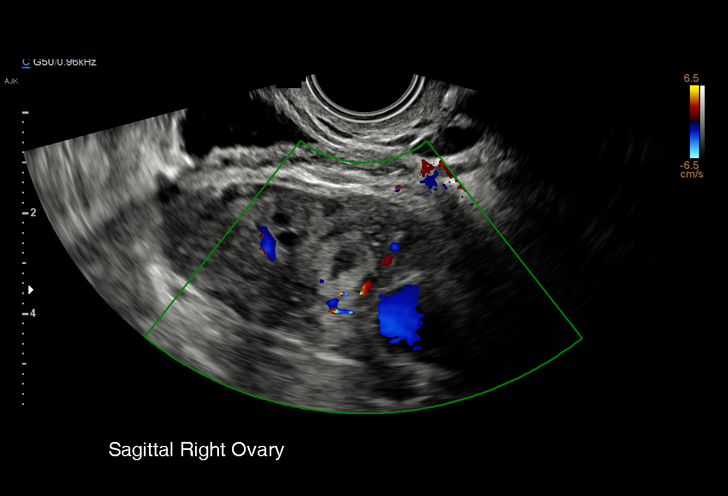
[im 24/34]
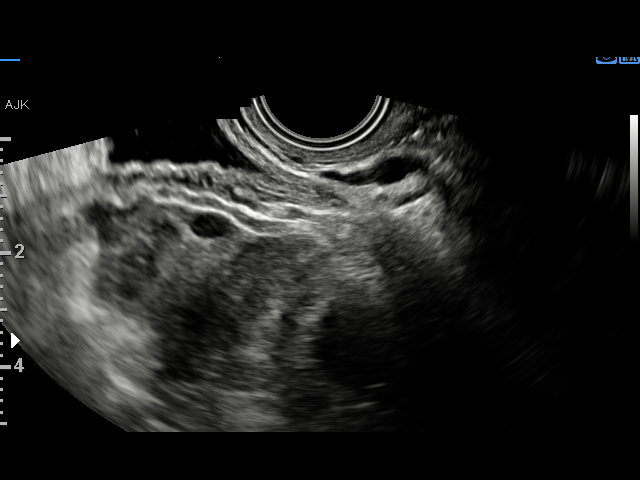
[im 26/34]
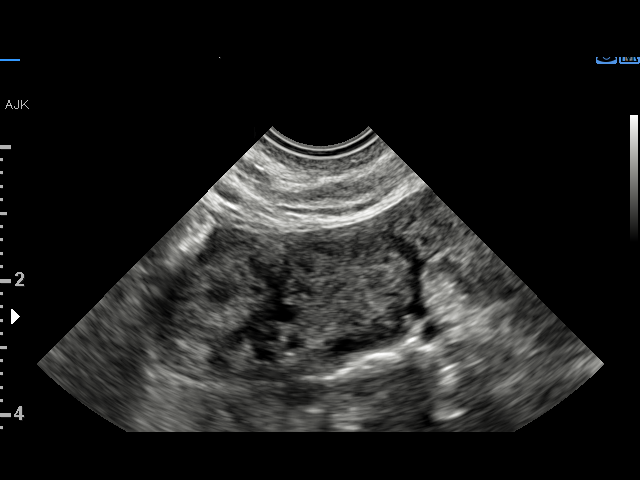
[im 29/34]
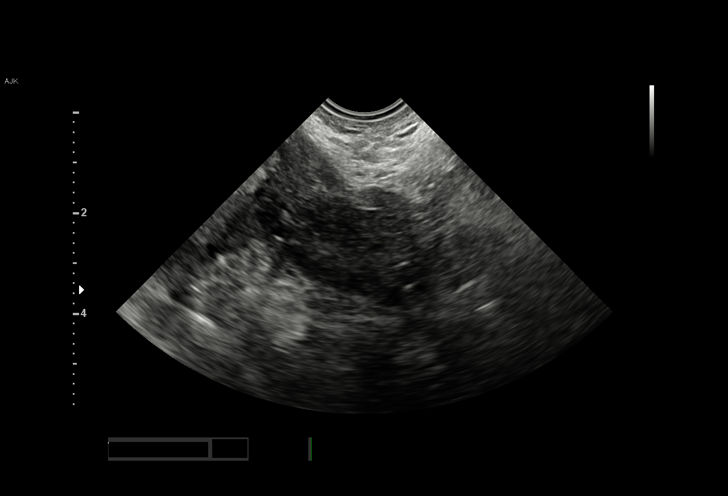
[im 31/34]
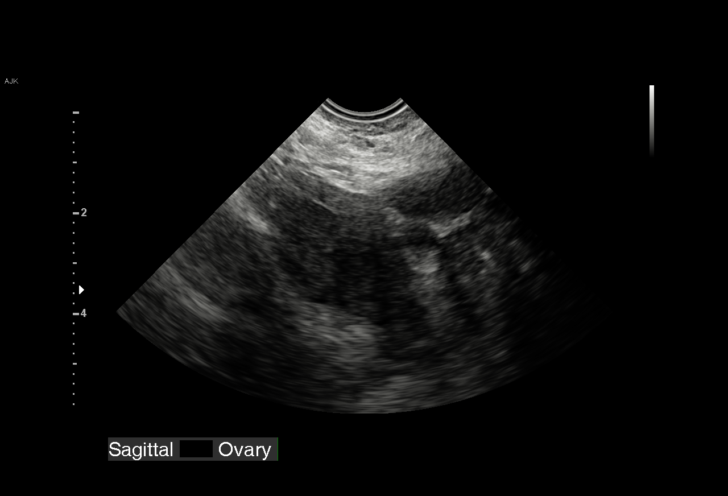
[im 34/34]
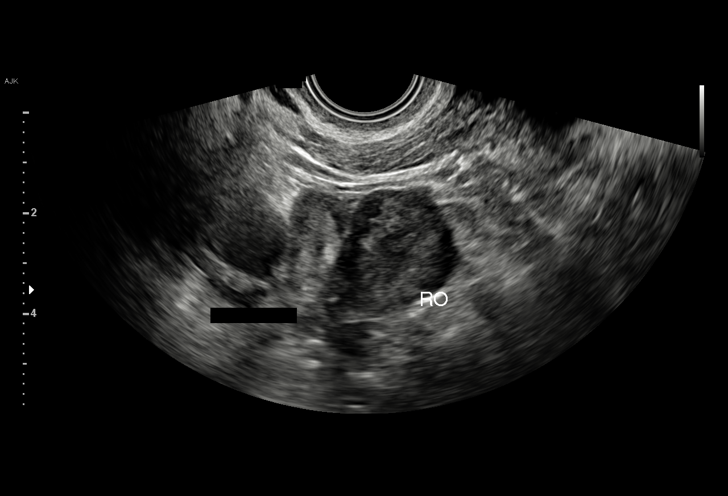

[15 of 28 positions shown; findings below may reference images not displayed]

FINDINGS: Intrauterine gestational sac: None

Maternal uterus/adnexae: No fibroids identified. Both ovaries are
normal in appearance. Now seen in the right adnexal region separate
from the ovary is a mass which contains a central sac like area with
echogenic rim. This measures 2.6 x 2.6 x 1.8 cm, and is consistent
with ectopic pregnancy. Small amount of simple free fluid noted in
pelvic cul-de-sac.
IMPRESSION: No IUP visualized. 2.6 cm right adnexal mass now seen, consistent
with ectopic pregnancy.

Small amount of simple free fluid in cul-de-sac.

Critical Value/emergent results were called by telephone at the time
of interpretation on 04/29/2020 at [DATE] to the patient's NAB nurse
Markia, who verbally acknowledged these results.

## 2022-04-28 IMAGING — DX DG ABDOMEN 1V
1 series · 1 of 1 positions shown · non-contrast
Comparison: None.

CLINICAL DATA: Pain

EXAM:
ABDOMEN - 1 VIEW

[abdomen kub]
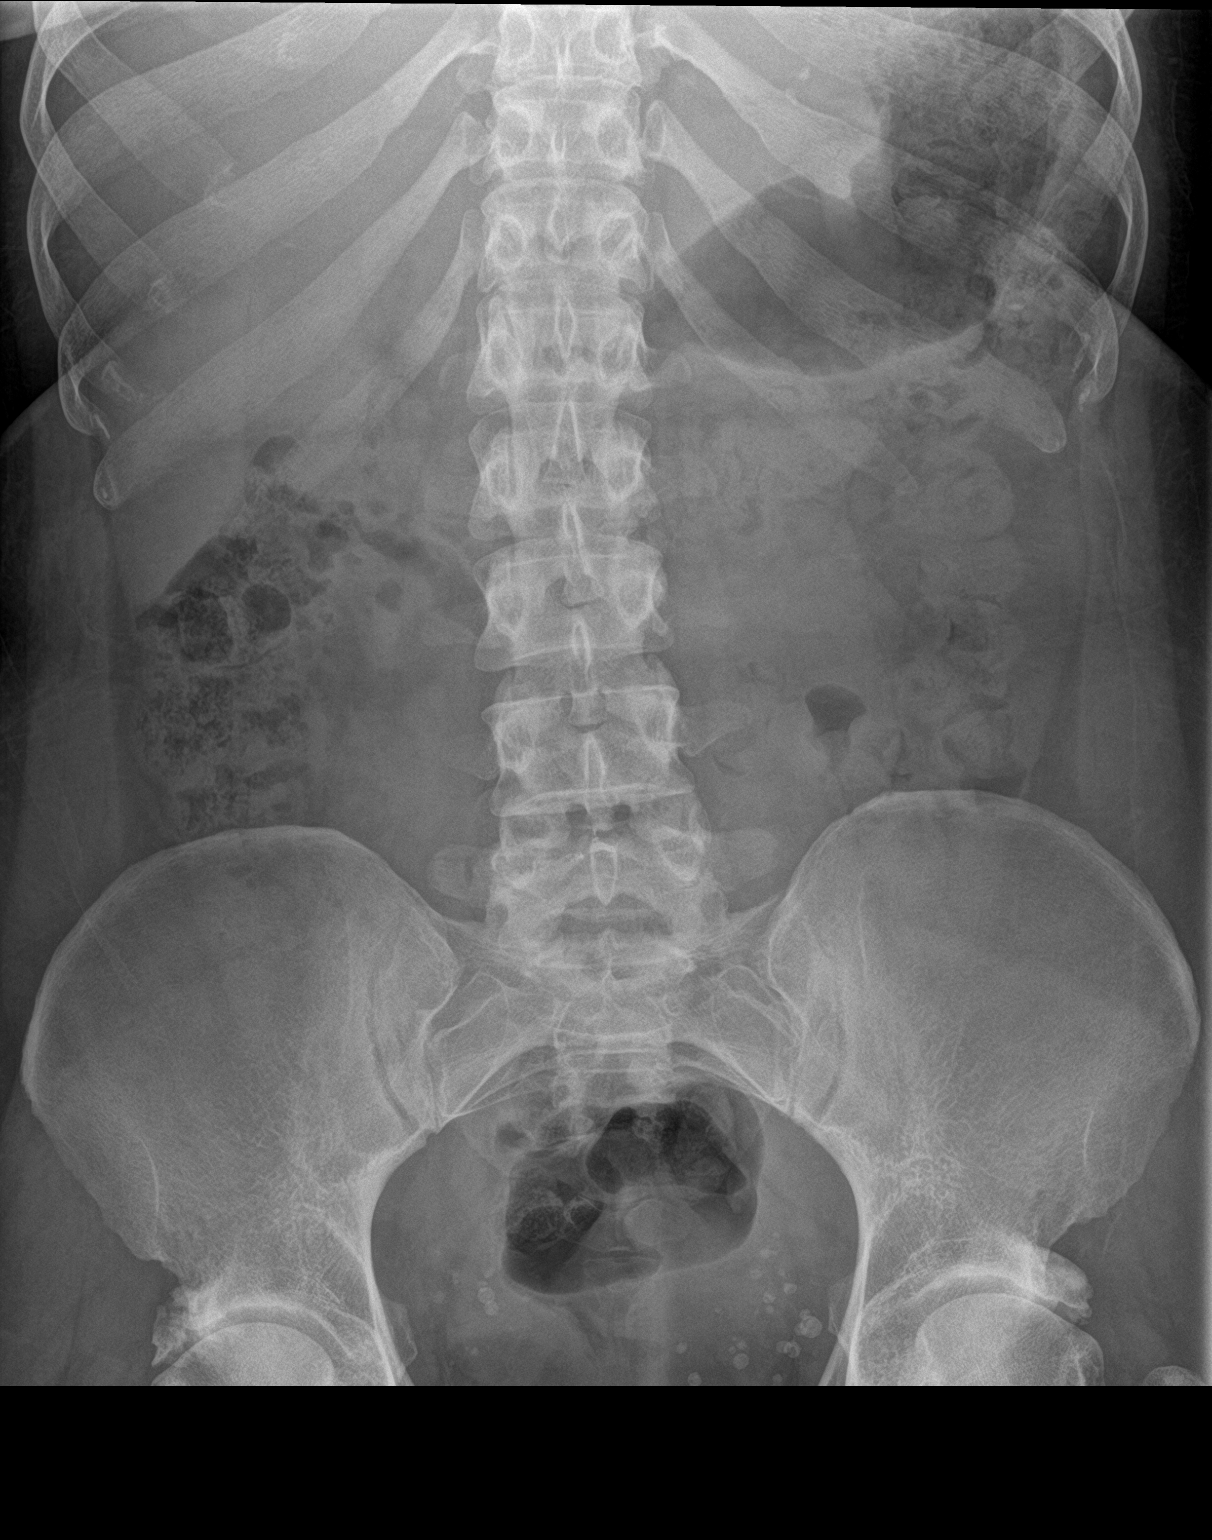

[1 of 1 positions shown; findings below may reference images not displayed]

FINDINGS: There is a moderate amount of stool in the colon. The bowel gas
pattern is nonobstructive. There are advanced degenerative changes
of both hips, substantially greater than expected for the patient's
age. Multiple calcifications project over the patient's pelvis and
are favored to represent phleboliths. There are few calcifications
projecting over the upper pole the left kidney which may represent
nephroliths.
IMPRESSION: 1. Nonobstructive bowel gas pattern.
2. Moderate stool burden.
3. Probable left-sided nephrolithiasis.
4. Advanced degenerative changes of both hips, substantially greater
than expected for the patient's age.
